# Patient Record
Sex: Female | Born: 1960 | Race: White | Hispanic: No | Marital: Married | Smoking: Never smoker
Health system: Southern US, Community
[De-identification: ages and names within clinical notes are randomized; demographics above are authoritative.]

## PROBLEM LIST (undated history)

## (undated) DIAGNOSIS — F32A Depression, unspecified: Secondary | ICD-10-CM

## (undated) DIAGNOSIS — F419 Anxiety disorder, unspecified: Secondary | ICD-10-CM

## (undated) DIAGNOSIS — R011 Cardiac murmur, unspecified: Secondary | ICD-10-CM

## (undated) DIAGNOSIS — C801 Malignant (primary) neoplasm, unspecified: Secondary | ICD-10-CM

## (undated) DIAGNOSIS — J189 Pneumonia, unspecified organism: Secondary | ICD-10-CM

## (undated) DIAGNOSIS — I10 Essential (primary) hypertension: Secondary | ICD-10-CM

## (undated) DIAGNOSIS — Z8719 Personal history of other diseases of the digestive system: Secondary | ICD-10-CM

## (undated) DIAGNOSIS — M1990 Unspecified osteoarthritis, unspecified site: Secondary | ICD-10-CM

## (undated) DIAGNOSIS — E785 Hyperlipidemia, unspecified: Secondary | ICD-10-CM

## (undated) DIAGNOSIS — E119 Type 2 diabetes mellitus without complications: Secondary | ICD-10-CM

## (undated) HISTORY — DX: Type 2 diabetes mellitus without complications: E11.9

## (undated) HISTORY — DX: Morbid (severe) obesity due to excess calories: E66.01

## (undated) HISTORY — DX: Anxiety disorder, unspecified: F41.9

## (undated) HISTORY — DX: Hyperlipidemia, unspecified: E78.5

## (undated) HISTORY — DX: Essential (primary) hypertension: I10

## (undated) HISTORY — PX: LIGAMENT REPAIR: SHX5444

## (undated) HISTORY — PX: LAPAROSCOPIC GASTRIC BANDING: SHX1100

## (undated) HISTORY — DX: Depression, unspecified: F32.A

---

## 1998-04-22 HISTORY — PX: REPLACEMENT TOTAL KNEE: SUR1224

## 2016-11-08 ENCOUNTER — Other Ambulatory Visit: Payer: Self-pay | Admitting: Family Medicine

## 2016-11-08 DIAGNOSIS — Z1231 Encounter for screening mammogram for malignant neoplasm of breast: Secondary | ICD-10-CM

## 2016-11-20 ENCOUNTER — Ambulatory Visit
Admission: RE | Admit: 2016-11-20 | Discharge: 2016-11-20 | Disposition: A | Discharge status: Home / Self Care | Payer: 59 | Source: Ambulatory Visit | Attending: Family Medicine | Admitting: Family Medicine

## 2016-11-20 ENCOUNTER — Encounter: Payer: Self-pay | Admitting: Radiology

## 2016-11-20 DIAGNOSIS — Z1231 Encounter for screening mammogram for malignant neoplasm of breast: Secondary | ICD-10-CM

## 2016-11-26 ENCOUNTER — Other Ambulatory Visit: Payer: Self-pay | Admitting: Family Medicine

## 2016-11-26 DIAGNOSIS — R928 Other abnormal and inconclusive findings on diagnostic imaging of breast: Secondary | ICD-10-CM

## 2016-11-28 ENCOUNTER — Ambulatory Visit
Admission: RE | Admit: 2016-11-28 | Discharge: 2016-11-28 | Disposition: A | Discharge status: Home / Self Care | Payer: 59 | Source: Ambulatory Visit | Attending: Family Medicine | Admitting: Family Medicine

## 2016-11-28 DIAGNOSIS — R928 Other abnormal and inconclusive findings on diagnostic imaging of breast: Secondary | ICD-10-CM

## 2017-05-13 ENCOUNTER — Other Ambulatory Visit: Payer: Self-pay | Admitting: Family Medicine

## 2017-05-13 ENCOUNTER — Ambulatory Visit
Admission: RE | Admit: 2017-05-13 | Discharge: 2017-05-13 | Disposition: A | Discharge status: Home / Self Care | Payer: 59 | Source: Ambulatory Visit | Attending: Family Medicine | Admitting: Family Medicine

## 2017-05-13 DIAGNOSIS — M170 Bilateral primary osteoarthritis of knee: Secondary | ICD-10-CM

## 2017-11-12 ENCOUNTER — Other Ambulatory Visit: Payer: Self-pay | Admitting: Family Medicine

## 2017-11-12 DIAGNOSIS — Z1231 Encounter for screening mammogram for malignant neoplasm of breast: Secondary | ICD-10-CM

## 2017-12-03 ENCOUNTER — Ambulatory Visit
Admission: RE | Admit: 2017-12-03 | Discharge: 2017-12-03 | Disposition: A | Discharge status: Home / Self Care | Payer: No Typology Code available for payment source | Source: Ambulatory Visit | Attending: Family Medicine | Admitting: Family Medicine

## 2017-12-03 DIAGNOSIS — Z1231 Encounter for screening mammogram for malignant neoplasm of breast: Secondary | ICD-10-CM

## 2018-05-25 DIAGNOSIS — R7303 Prediabetes: Secondary | ICD-10-CM | POA: Diagnosis not present

## 2018-05-25 DIAGNOSIS — Z6841 Body Mass Index (BMI) 40.0 and over, adult: Secondary | ICD-10-CM | POA: Diagnosis not present

## 2018-05-25 DIAGNOSIS — E785 Hyperlipidemia, unspecified: Secondary | ICD-10-CM | POA: Diagnosis not present

## 2018-05-25 DIAGNOSIS — I10 Essential (primary) hypertension: Secondary | ICD-10-CM | POA: Diagnosis not present

## 2018-06-05 DIAGNOSIS — M542 Cervicalgia: Secondary | ICD-10-CM | POA: Diagnosis not present

## 2018-06-05 DIAGNOSIS — M545 Low back pain: Secondary | ICD-10-CM | POA: Diagnosis not present

## 2018-06-05 DIAGNOSIS — Z79891 Long term (current) use of opiate analgesic: Secondary | ICD-10-CM | POA: Diagnosis not present

## 2018-06-05 DIAGNOSIS — G894 Chronic pain syndrome: Secondary | ICD-10-CM | POA: Diagnosis not present

## 2018-06-05 DIAGNOSIS — M25569 Pain in unspecified knee: Secondary | ICD-10-CM | POA: Diagnosis not present

## 2018-06-24 DIAGNOSIS — R7303 Prediabetes: Secondary | ICD-10-CM | POA: Diagnosis not present

## 2018-06-24 DIAGNOSIS — Z6841 Body Mass Index (BMI) 40.0 and over, adult: Secondary | ICD-10-CM | POA: Diagnosis not present

## 2018-06-24 DIAGNOSIS — E782 Mixed hyperlipidemia: Secondary | ICD-10-CM | POA: Diagnosis not present

## 2018-06-24 DIAGNOSIS — I10 Essential (primary) hypertension: Secondary | ICD-10-CM | POA: Diagnosis not present

## 2018-06-24 DIAGNOSIS — J309 Allergic rhinitis, unspecified: Secondary | ICD-10-CM | POA: Diagnosis not present

## 2018-08-28 DIAGNOSIS — Z79891 Long term (current) use of opiate analgesic: Secondary | ICD-10-CM | POA: Diagnosis not present

## 2018-08-28 DIAGNOSIS — G894 Chronic pain syndrome: Secondary | ICD-10-CM | POA: Diagnosis not present

## 2018-08-28 DIAGNOSIS — M542 Cervicalgia: Secondary | ICD-10-CM | POA: Diagnosis not present

## 2018-08-28 DIAGNOSIS — M25569 Pain in unspecified knee: Secondary | ICD-10-CM | POA: Diagnosis not present

## 2018-08-28 DIAGNOSIS — M545 Low back pain: Secondary | ICD-10-CM | POA: Diagnosis not present

## 2018-10-29 DIAGNOSIS — F411 Generalized anxiety disorder: Secondary | ICD-10-CM | POA: Diagnosis not present

## 2018-10-29 DIAGNOSIS — E782 Mixed hyperlipidemia: Secondary | ICD-10-CM | POA: Diagnosis not present

## 2018-10-29 DIAGNOSIS — I10 Essential (primary) hypertension: Secondary | ICD-10-CM | POA: Diagnosis not present

## 2018-10-29 DIAGNOSIS — F331 Major depressive disorder, recurrent, moderate: Secondary | ICD-10-CM | POA: Diagnosis not present

## 2018-10-29 DIAGNOSIS — E1165 Type 2 diabetes mellitus with hyperglycemia: Secondary | ICD-10-CM | POA: Diagnosis not present

## 2018-11-16 DIAGNOSIS — E782 Mixed hyperlipidemia: Secondary | ICD-10-CM | POA: Diagnosis not present

## 2018-11-16 DIAGNOSIS — I10 Essential (primary) hypertension: Secondary | ICD-10-CM | POA: Diagnosis not present

## 2018-11-16 DIAGNOSIS — E1165 Type 2 diabetes mellitus with hyperglycemia: Secondary | ICD-10-CM | POA: Diagnosis not present

## 2018-11-20 DIAGNOSIS — F331 Major depressive disorder, recurrent, moderate: Secondary | ICD-10-CM | POA: Diagnosis not present

## 2018-11-20 DIAGNOSIS — F411 Generalized anxiety disorder: Secondary | ICD-10-CM | POA: Diagnosis not present

## 2018-11-20 DIAGNOSIS — E1165 Type 2 diabetes mellitus with hyperglycemia: Secondary | ICD-10-CM | POA: Diagnosis not present

## 2018-11-20 DIAGNOSIS — I10 Essential (primary) hypertension: Secondary | ICD-10-CM | POA: Diagnosis not present

## 2018-11-20 DIAGNOSIS — E782 Mixed hyperlipidemia: Secondary | ICD-10-CM | POA: Diagnosis not present

## 2018-11-30 DIAGNOSIS — M545 Low back pain: Secondary | ICD-10-CM | POA: Diagnosis not present

## 2018-11-30 DIAGNOSIS — M25569 Pain in unspecified knee: Secondary | ICD-10-CM | POA: Diagnosis not present

## 2018-11-30 DIAGNOSIS — M542 Cervicalgia: Secondary | ICD-10-CM | POA: Diagnosis not present

## 2018-11-30 DIAGNOSIS — G894 Chronic pain syndrome: Secondary | ICD-10-CM | POA: Diagnosis not present

## 2018-11-30 DIAGNOSIS — Z79891 Long term (current) use of opiate analgesic: Secondary | ICD-10-CM | POA: Diagnosis not present

## 2019-01-14 ENCOUNTER — Other Ambulatory Visit: Payer: Self-pay | Admitting: Family Medicine

## 2019-01-14 ENCOUNTER — Ambulatory Visit
Admission: RE | Admit: 2019-01-14 | Discharge: 2019-01-14 | Disposition: A | Discharge status: Home / Self Care | Payer: No Typology Code available for payment source | Source: Ambulatory Visit | Attending: Family Medicine | Admitting: Family Medicine

## 2019-01-14 ENCOUNTER — Other Ambulatory Visit: Payer: Self-pay

## 2019-01-14 DIAGNOSIS — Z1231 Encounter for screening mammogram for malignant neoplasm of breast: Secondary | ICD-10-CM

## 2019-02-08 DIAGNOSIS — E1165 Type 2 diabetes mellitus with hyperglycemia: Secondary | ICD-10-CM | POA: Diagnosis not present

## 2019-02-15 DIAGNOSIS — F411 Generalized anxiety disorder: Secondary | ICD-10-CM | POA: Diagnosis not present

## 2019-02-15 DIAGNOSIS — E1165 Type 2 diabetes mellitus with hyperglycemia: Secondary | ICD-10-CM | POA: Diagnosis not present

## 2019-02-15 DIAGNOSIS — I10 Essential (primary) hypertension: Secondary | ICD-10-CM | POA: Diagnosis not present

## 2019-03-01 DIAGNOSIS — Z79891 Long term (current) use of opiate analgesic: Secondary | ICD-10-CM | POA: Diagnosis not present

## 2019-03-01 DIAGNOSIS — M25569 Pain in unspecified knee: Secondary | ICD-10-CM | POA: Diagnosis not present

## 2019-03-01 DIAGNOSIS — E785 Hyperlipidemia, unspecified: Secondary | ICD-10-CM | POA: Diagnosis not present

## 2019-03-01 DIAGNOSIS — E782 Mixed hyperlipidemia: Secondary | ICD-10-CM | POA: Diagnosis not present

## 2019-03-01 DIAGNOSIS — M545 Low back pain: Secondary | ICD-10-CM | POA: Diagnosis not present

## 2019-03-01 DIAGNOSIS — E1165 Type 2 diabetes mellitus with hyperglycemia: Secondary | ICD-10-CM | POA: Diagnosis not present

## 2019-03-01 DIAGNOSIS — M542 Cervicalgia: Secondary | ICD-10-CM | POA: Diagnosis not present

## 2019-03-01 DIAGNOSIS — G894 Chronic pain syndrome: Secondary | ICD-10-CM | POA: Diagnosis not present

## 2019-03-01 DIAGNOSIS — I10 Essential (primary) hypertension: Secondary | ICD-10-CM | POA: Diagnosis not present

## 2019-03-03 DIAGNOSIS — Z0000 Encounter for general adult medical examination without abnormal findings: Secondary | ICD-10-CM | POA: Diagnosis not present

## 2019-03-03 DIAGNOSIS — Z23 Encounter for immunization: Secondary | ICD-10-CM | POA: Diagnosis not present

## 2019-03-03 DIAGNOSIS — Z6841 Body Mass Index (BMI) 40.0 and over, adult: Secondary | ICD-10-CM | POA: Diagnosis not present

## 2019-03-03 DIAGNOSIS — Z1211 Encounter for screening for malignant neoplasm of colon: Secondary | ICD-10-CM | POA: Diagnosis not present

## 2019-05-11 DIAGNOSIS — M4722 Other spondylosis with radiculopathy, cervical region: Secondary | ICD-10-CM | POA: Diagnosis not present

## 2019-05-11 DIAGNOSIS — M4726 Other spondylosis with radiculopathy, lumbar region: Secondary | ICD-10-CM | POA: Diagnosis not present

## 2019-05-11 DIAGNOSIS — M9902 Segmental and somatic dysfunction of thoracic region: Secondary | ICD-10-CM | POA: Diagnosis not present

## 2019-05-11 DIAGNOSIS — M9901 Segmental and somatic dysfunction of cervical region: Secondary | ICD-10-CM | POA: Diagnosis not present

## 2019-05-11 DIAGNOSIS — M9903 Segmental and somatic dysfunction of lumbar region: Secondary | ICD-10-CM | POA: Diagnosis not present

## 2019-05-11 DIAGNOSIS — M62830 Muscle spasm of back: Secondary | ICD-10-CM | POA: Diagnosis not present

## 2019-05-17 DIAGNOSIS — M62830 Muscle spasm of back: Secondary | ICD-10-CM | POA: Diagnosis not present

## 2019-05-17 DIAGNOSIS — M9902 Segmental and somatic dysfunction of thoracic region: Secondary | ICD-10-CM | POA: Diagnosis not present

## 2019-05-17 DIAGNOSIS — M9901 Segmental and somatic dysfunction of cervical region: Secondary | ICD-10-CM | POA: Diagnosis not present

## 2019-05-17 DIAGNOSIS — M4722 Other spondylosis with radiculopathy, cervical region: Secondary | ICD-10-CM | POA: Diagnosis not present

## 2019-05-17 DIAGNOSIS — M4726 Other spondylosis with radiculopathy, lumbar region: Secondary | ICD-10-CM | POA: Diagnosis not present

## 2019-05-17 DIAGNOSIS — M9903 Segmental and somatic dysfunction of lumbar region: Secondary | ICD-10-CM | POA: Diagnosis not present

## 2019-05-19 DIAGNOSIS — M9903 Segmental and somatic dysfunction of lumbar region: Secondary | ICD-10-CM | POA: Diagnosis not present

## 2019-05-19 DIAGNOSIS — M9901 Segmental and somatic dysfunction of cervical region: Secondary | ICD-10-CM | POA: Diagnosis not present

## 2019-05-19 DIAGNOSIS — M4722 Other spondylosis with radiculopathy, cervical region: Secondary | ICD-10-CM | POA: Diagnosis not present

## 2019-05-19 DIAGNOSIS — M4726 Other spondylosis with radiculopathy, lumbar region: Secondary | ICD-10-CM | POA: Diagnosis not present

## 2019-05-19 DIAGNOSIS — M62830 Muscle spasm of back: Secondary | ICD-10-CM | POA: Diagnosis not present

## 2019-05-19 DIAGNOSIS — M9902 Segmental and somatic dysfunction of thoracic region: Secondary | ICD-10-CM | POA: Diagnosis not present

## 2019-05-24 DIAGNOSIS — M4722 Other spondylosis with radiculopathy, cervical region: Secondary | ICD-10-CM | POA: Diagnosis not present

## 2019-05-24 DIAGNOSIS — M4726 Other spondylosis with radiculopathy, lumbar region: Secondary | ICD-10-CM | POA: Diagnosis not present

## 2019-05-24 DIAGNOSIS — M62830 Muscle spasm of back: Secondary | ICD-10-CM | POA: Diagnosis not present

## 2019-05-24 DIAGNOSIS — M9902 Segmental and somatic dysfunction of thoracic region: Secondary | ICD-10-CM | POA: Diagnosis not present

## 2019-05-24 DIAGNOSIS — M9903 Segmental and somatic dysfunction of lumbar region: Secondary | ICD-10-CM | POA: Diagnosis not present

## 2019-05-24 DIAGNOSIS — M9901 Segmental and somatic dysfunction of cervical region: Secondary | ICD-10-CM | POA: Diagnosis not present

## 2019-05-26 DIAGNOSIS — M9902 Segmental and somatic dysfunction of thoracic region: Secondary | ICD-10-CM | POA: Diagnosis not present

## 2019-05-26 DIAGNOSIS — M62830 Muscle spasm of back: Secondary | ICD-10-CM | POA: Diagnosis not present

## 2019-05-26 DIAGNOSIS — M9903 Segmental and somatic dysfunction of lumbar region: Secondary | ICD-10-CM | POA: Diagnosis not present

## 2019-05-26 DIAGNOSIS — M9901 Segmental and somatic dysfunction of cervical region: Secondary | ICD-10-CM | POA: Diagnosis not present

## 2019-05-26 DIAGNOSIS — M4722 Other spondylosis with radiculopathy, cervical region: Secondary | ICD-10-CM | POA: Diagnosis not present

## 2019-05-26 DIAGNOSIS — M4726 Other spondylosis with radiculopathy, lumbar region: Secondary | ICD-10-CM | POA: Diagnosis not present

## 2019-05-27 DIAGNOSIS — G894 Chronic pain syndrome: Secondary | ICD-10-CM | POA: Diagnosis not present

## 2019-05-27 DIAGNOSIS — Z79891 Long term (current) use of opiate analgesic: Secondary | ICD-10-CM | POA: Diagnosis not present

## 2019-05-27 DIAGNOSIS — M545 Low back pain: Secondary | ICD-10-CM | POA: Diagnosis not present

## 2019-05-27 DIAGNOSIS — M542 Cervicalgia: Secondary | ICD-10-CM | POA: Diagnosis not present

## 2019-05-27 DIAGNOSIS — M25569 Pain in unspecified knee: Secondary | ICD-10-CM | POA: Diagnosis not present

## 2019-05-31 DIAGNOSIS — M9902 Segmental and somatic dysfunction of thoracic region: Secondary | ICD-10-CM | POA: Diagnosis not present

## 2019-05-31 DIAGNOSIS — M62830 Muscle spasm of back: Secondary | ICD-10-CM | POA: Diagnosis not present

## 2019-05-31 DIAGNOSIS — M9901 Segmental and somatic dysfunction of cervical region: Secondary | ICD-10-CM | POA: Diagnosis not present

## 2019-05-31 DIAGNOSIS — M4722 Other spondylosis with radiculopathy, cervical region: Secondary | ICD-10-CM | POA: Diagnosis not present

## 2019-05-31 DIAGNOSIS — M9903 Segmental and somatic dysfunction of lumbar region: Secondary | ICD-10-CM | POA: Diagnosis not present

## 2019-05-31 DIAGNOSIS — E785 Hyperlipidemia, unspecified: Secondary | ICD-10-CM | POA: Diagnosis not present

## 2019-05-31 DIAGNOSIS — M4726 Other spondylosis with radiculopathy, lumbar region: Secondary | ICD-10-CM | POA: Diagnosis not present

## 2019-05-31 DIAGNOSIS — E1165 Type 2 diabetes mellitus with hyperglycemia: Secondary | ICD-10-CM | POA: Diagnosis not present

## 2019-06-02 DIAGNOSIS — E1165 Type 2 diabetes mellitus with hyperglycemia: Secondary | ICD-10-CM | POA: Diagnosis not present

## 2019-06-02 DIAGNOSIS — Z6841 Body Mass Index (BMI) 40.0 and over, adult: Secondary | ICD-10-CM | POA: Diagnosis not present

## 2019-06-02 DIAGNOSIS — M62830 Muscle spasm of back: Secondary | ICD-10-CM | POA: Diagnosis not present

## 2019-06-02 DIAGNOSIS — M9901 Segmental and somatic dysfunction of cervical region: Secondary | ICD-10-CM | POA: Diagnosis not present

## 2019-06-02 DIAGNOSIS — I10 Essential (primary) hypertension: Secondary | ICD-10-CM | POA: Diagnosis not present

## 2019-06-02 DIAGNOSIS — M9902 Segmental and somatic dysfunction of thoracic region: Secondary | ICD-10-CM | POA: Diagnosis not present

## 2019-06-02 DIAGNOSIS — M4726 Other spondylosis with radiculopathy, lumbar region: Secondary | ICD-10-CM | POA: Diagnosis not present

## 2019-06-02 DIAGNOSIS — M9903 Segmental and somatic dysfunction of lumbar region: Secondary | ICD-10-CM | POA: Diagnosis not present

## 2019-06-02 DIAGNOSIS — M4722 Other spondylosis with radiculopathy, cervical region: Secondary | ICD-10-CM | POA: Diagnosis not present

## 2019-06-14 DIAGNOSIS — M4722 Other spondylosis with radiculopathy, cervical region: Secondary | ICD-10-CM | POA: Diagnosis not present

## 2019-06-14 DIAGNOSIS — M9901 Segmental and somatic dysfunction of cervical region: Secondary | ICD-10-CM | POA: Diagnosis not present

## 2019-06-14 DIAGNOSIS — M4726 Other spondylosis with radiculopathy, lumbar region: Secondary | ICD-10-CM | POA: Diagnosis not present

## 2019-06-14 DIAGNOSIS — M9902 Segmental and somatic dysfunction of thoracic region: Secondary | ICD-10-CM | POA: Diagnosis not present

## 2019-06-14 DIAGNOSIS — M62830 Muscle spasm of back: Secondary | ICD-10-CM | POA: Diagnosis not present

## 2019-06-14 DIAGNOSIS — M9903 Segmental and somatic dysfunction of lumbar region: Secondary | ICD-10-CM | POA: Diagnosis not present

## 2019-06-16 DIAGNOSIS — M4726 Other spondylosis with radiculopathy, lumbar region: Secondary | ICD-10-CM | POA: Diagnosis not present

## 2019-06-16 DIAGNOSIS — M9903 Segmental and somatic dysfunction of lumbar region: Secondary | ICD-10-CM | POA: Diagnosis not present

## 2019-06-16 DIAGNOSIS — M4722 Other spondylosis with radiculopathy, cervical region: Secondary | ICD-10-CM | POA: Diagnosis not present

## 2019-06-16 DIAGNOSIS — M9901 Segmental and somatic dysfunction of cervical region: Secondary | ICD-10-CM | POA: Diagnosis not present

## 2019-06-16 DIAGNOSIS — M9902 Segmental and somatic dysfunction of thoracic region: Secondary | ICD-10-CM | POA: Diagnosis not present

## 2019-06-16 DIAGNOSIS — M62830 Muscle spasm of back: Secondary | ICD-10-CM | POA: Diagnosis not present

## 2019-06-21 DIAGNOSIS — M62830 Muscle spasm of back: Secondary | ICD-10-CM | POA: Diagnosis not present

## 2019-06-21 DIAGNOSIS — M4726 Other spondylosis with radiculopathy, lumbar region: Secondary | ICD-10-CM | POA: Diagnosis not present

## 2019-06-21 DIAGNOSIS — M9901 Segmental and somatic dysfunction of cervical region: Secondary | ICD-10-CM | POA: Diagnosis not present

## 2019-06-21 DIAGNOSIS — M9902 Segmental and somatic dysfunction of thoracic region: Secondary | ICD-10-CM | POA: Diagnosis not present

## 2019-06-21 DIAGNOSIS — M9903 Segmental and somatic dysfunction of lumbar region: Secondary | ICD-10-CM | POA: Diagnosis not present

## 2019-06-21 DIAGNOSIS — M4722 Other spondylosis with radiculopathy, cervical region: Secondary | ICD-10-CM | POA: Diagnosis not present

## 2019-06-23 DIAGNOSIS — M9903 Segmental and somatic dysfunction of lumbar region: Secondary | ICD-10-CM | POA: Diagnosis not present

## 2019-06-23 DIAGNOSIS — M9901 Segmental and somatic dysfunction of cervical region: Secondary | ICD-10-CM | POA: Diagnosis not present

## 2019-06-23 DIAGNOSIS — M4726 Other spondylosis with radiculopathy, lumbar region: Secondary | ICD-10-CM | POA: Diagnosis not present

## 2019-06-23 DIAGNOSIS — M9902 Segmental and somatic dysfunction of thoracic region: Secondary | ICD-10-CM | POA: Diagnosis not present

## 2019-06-23 DIAGNOSIS — M62830 Muscle spasm of back: Secondary | ICD-10-CM | POA: Diagnosis not present

## 2019-06-23 DIAGNOSIS — M4722 Other spondylosis with radiculopathy, cervical region: Secondary | ICD-10-CM | POA: Diagnosis not present

## 2019-06-30 DIAGNOSIS — E559 Vitamin D deficiency, unspecified: Secondary | ICD-10-CM | POA: Diagnosis not present

## 2019-06-30 DIAGNOSIS — I10 Essential (primary) hypertension: Secondary | ICD-10-CM | POA: Diagnosis not present

## 2019-06-30 DIAGNOSIS — E1165 Type 2 diabetes mellitus with hyperglycemia: Secondary | ICD-10-CM | POA: Diagnosis not present

## 2019-06-30 DIAGNOSIS — Z0000 Encounter for general adult medical examination without abnormal findings: Secondary | ICD-10-CM | POA: Diagnosis not present

## 2019-07-05 DIAGNOSIS — I10 Essential (primary) hypertension: Secondary | ICD-10-CM | POA: Diagnosis not present

## 2019-07-05 DIAGNOSIS — E1165 Type 2 diabetes mellitus with hyperglycemia: Secondary | ICD-10-CM | POA: Diagnosis not present

## 2019-07-07 DIAGNOSIS — M9903 Segmental and somatic dysfunction of lumbar region: Secondary | ICD-10-CM | POA: Diagnosis not present

## 2019-07-07 DIAGNOSIS — M9902 Segmental and somatic dysfunction of thoracic region: Secondary | ICD-10-CM | POA: Diagnosis not present

## 2019-07-07 DIAGNOSIS — M62830 Muscle spasm of back: Secondary | ICD-10-CM | POA: Diagnosis not present

## 2019-07-07 DIAGNOSIS — M4726 Other spondylosis with radiculopathy, lumbar region: Secondary | ICD-10-CM | POA: Diagnosis not present

## 2019-07-07 DIAGNOSIS — M4722 Other spondylosis with radiculopathy, cervical region: Secondary | ICD-10-CM | POA: Diagnosis not present

## 2019-07-07 DIAGNOSIS — M9901 Segmental and somatic dysfunction of cervical region: Secondary | ICD-10-CM | POA: Diagnosis not present

## 2019-07-19 DIAGNOSIS — J309 Allergic rhinitis, unspecified: Secondary | ICD-10-CM | POA: Diagnosis not present

## 2019-07-19 DIAGNOSIS — J329 Chronic sinusitis, unspecified: Secondary | ICD-10-CM | POA: Diagnosis not present

## 2019-08-25 DIAGNOSIS — M25569 Pain in unspecified knee: Secondary | ICD-10-CM | POA: Diagnosis not present

## 2019-08-25 DIAGNOSIS — M545 Low back pain: Secondary | ICD-10-CM | POA: Diagnosis not present

## 2019-08-25 DIAGNOSIS — G894 Chronic pain syndrome: Secondary | ICD-10-CM | POA: Diagnosis not present

## 2019-08-25 DIAGNOSIS — M542 Cervicalgia: Secondary | ICD-10-CM | POA: Diagnosis not present

## 2019-08-25 DIAGNOSIS — Z79891 Long term (current) use of opiate analgesic: Secondary | ICD-10-CM | POA: Diagnosis not present

## 2019-08-31 DIAGNOSIS — Z23 Encounter for immunization: Secondary | ICD-10-CM | POA: Diagnosis not present

## 2019-08-31 DIAGNOSIS — M25512 Pain in left shoulder: Secondary | ICD-10-CM | POA: Diagnosis not present

## 2019-09-30 DIAGNOSIS — E1165 Type 2 diabetes mellitus with hyperglycemia: Secondary | ICD-10-CM | POA: Diagnosis not present

## 2019-10-04 DIAGNOSIS — I152 Hypertension secondary to endocrine disorders: Secondary | ICD-10-CM | POA: Diagnosis not present

## 2019-10-04 DIAGNOSIS — E1165 Type 2 diabetes mellitus with hyperglycemia: Secondary | ICD-10-CM | POA: Diagnosis not present

## 2019-11-05 DIAGNOSIS — E785 Hyperlipidemia, unspecified: Secondary | ICD-10-CM | POA: Diagnosis not present

## 2019-11-05 DIAGNOSIS — E1165 Type 2 diabetes mellitus with hyperglycemia: Secondary | ICD-10-CM | POA: Diagnosis not present

## 2019-11-05 DIAGNOSIS — I10 Essential (primary) hypertension: Secondary | ICD-10-CM | POA: Diagnosis not present

## 2019-11-12 DIAGNOSIS — M545 Low back pain: Secondary | ICD-10-CM | POA: Diagnosis not present

## 2019-11-12 DIAGNOSIS — M542 Cervicalgia: Secondary | ICD-10-CM | POA: Diagnosis not present

## 2019-12-15 ENCOUNTER — Other Ambulatory Visit: Payer: Self-pay | Admitting: Family Medicine

## 2019-12-15 DIAGNOSIS — Z1231 Encounter for screening mammogram for malignant neoplasm of breast: Secondary | ICD-10-CM

## 2019-12-20 DIAGNOSIS — Z79891 Long term (current) use of opiate analgesic: Secondary | ICD-10-CM | POA: Diagnosis not present

## 2020-01-18 ENCOUNTER — Ambulatory Visit: Payer: No Typology Code available for payment source

## 2020-01-21 DIAGNOSIS — J329 Chronic sinusitis, unspecified: Secondary | ICD-10-CM | POA: Diagnosis not present

## 2020-02-02 ENCOUNTER — Other Ambulatory Visit: Payer: Self-pay

## 2020-02-02 ENCOUNTER — Ambulatory Visit
Admission: RE | Admit: 2020-02-02 | Discharge: 2020-02-02 | Disposition: A | Discharge status: Home / Self Care | Payer: Medicare Other | Source: Ambulatory Visit | Attending: Family Medicine | Admitting: Family Medicine

## 2020-02-02 DIAGNOSIS — Z1231 Encounter for screening mammogram for malignant neoplasm of breast: Secondary | ICD-10-CM | POA: Diagnosis not present

## 2020-03-11 DIAGNOSIS — Z79899 Other long term (current) drug therapy: Secondary | ICD-10-CM | POA: Diagnosis not present

## 2020-03-11 DIAGNOSIS — G894 Chronic pain syndrome: Secondary | ICD-10-CM | POA: Insufficient documentation

## 2020-03-11 DIAGNOSIS — Z79891 Long term (current) use of opiate analgesic: Secondary | ICD-10-CM | POA: Diagnosis not present

## 2020-03-11 DIAGNOSIS — M545 Low back pain, unspecified: Secondary | ICD-10-CM | POA: Diagnosis not present

## 2020-03-13 DIAGNOSIS — Z5181 Encounter for therapeutic drug level monitoring: Secondary | ICD-10-CM | POA: Diagnosis not present

## 2020-03-13 DIAGNOSIS — Z79899 Other long term (current) drug therapy: Secondary | ICD-10-CM | POA: Diagnosis not present

## 2020-03-14 DIAGNOSIS — Z1331 Encounter for screening for depression: Secondary | ICD-10-CM | POA: Diagnosis not present

## 2020-03-14 DIAGNOSIS — Z0000 Encounter for general adult medical examination without abnormal findings: Secondary | ICD-10-CM | POA: Diagnosis not present

## 2020-03-14 DIAGNOSIS — Z1339 Encounter for screening examination for other mental health and behavioral disorders: Secondary | ICD-10-CM | POA: Diagnosis not present

## 2020-03-14 DIAGNOSIS — Z23 Encounter for immunization: Secondary | ICD-10-CM | POA: Diagnosis not present

## 2020-03-23 DIAGNOSIS — E1165 Type 2 diabetes mellitus with hyperglycemia: Secondary | ICD-10-CM | POA: Diagnosis not present

## 2020-03-23 DIAGNOSIS — E785 Hyperlipidemia, unspecified: Secondary | ICD-10-CM | POA: Diagnosis not present

## 2020-03-23 DIAGNOSIS — E782 Mixed hyperlipidemia: Secondary | ICD-10-CM | POA: Diagnosis not present

## 2020-03-23 DIAGNOSIS — I10 Essential (primary) hypertension: Secondary | ICD-10-CM | POA: Diagnosis not present

## 2020-03-27 DIAGNOSIS — E782 Mixed hyperlipidemia: Secondary | ICD-10-CM | POA: Diagnosis not present

## 2020-03-27 DIAGNOSIS — G4700 Insomnia, unspecified: Secondary | ICD-10-CM | POA: Diagnosis not present

## 2020-03-27 DIAGNOSIS — E1159 Type 2 diabetes mellitus with other circulatory complications: Secondary | ICD-10-CM | POA: Diagnosis not present

## 2020-03-27 DIAGNOSIS — E1169 Type 2 diabetes mellitus with other specified complication: Secondary | ICD-10-CM | POA: Diagnosis not present

## 2020-03-27 DIAGNOSIS — I10 Essential (primary) hypertension: Secondary | ICD-10-CM | POA: Diagnosis not present

## 2020-03-27 DIAGNOSIS — E1165 Type 2 diabetes mellitus with hyperglycemia: Secondary | ICD-10-CM | POA: Diagnosis not present

## 2020-03-27 DIAGNOSIS — F331 Major depressive disorder, recurrent, moderate: Secondary | ICD-10-CM | POA: Diagnosis not present

## 2020-03-27 DIAGNOSIS — F411 Generalized anxiety disorder: Secondary | ICD-10-CM | POA: Diagnosis not present

## 2020-04-22 HISTORY — PX: ABDOMINAL HYSTERECTOMY: SHX81

## 2020-04-28 DIAGNOSIS — Z20822 Contact with and (suspected) exposure to covid-19: Secondary | ICD-10-CM | POA: Diagnosis not present

## 2020-06-05 DIAGNOSIS — M5459 Other low back pain: Secondary | ICD-10-CM | POA: Diagnosis not present

## 2020-06-28 DIAGNOSIS — E1165 Type 2 diabetes mellitus with hyperglycemia: Secondary | ICD-10-CM | POA: Diagnosis not present

## 2020-06-30 DIAGNOSIS — I152 Hypertension secondary to endocrine disorders: Secondary | ICD-10-CM | POA: Diagnosis not present

## 2020-06-30 DIAGNOSIS — I10 Essential (primary) hypertension: Secondary | ICD-10-CM | POA: Diagnosis not present

## 2020-06-30 DIAGNOSIS — E1169 Type 2 diabetes mellitus with other specified complication: Secondary | ICD-10-CM | POA: Diagnosis not present

## 2020-06-30 DIAGNOSIS — F331 Major depressive disorder, recurrent, moderate: Secondary | ICD-10-CM | POA: Diagnosis not present

## 2020-06-30 DIAGNOSIS — E1165 Type 2 diabetes mellitus with hyperglycemia: Secondary | ICD-10-CM | POA: Diagnosis not present

## 2020-06-30 DIAGNOSIS — G4700 Insomnia, unspecified: Secondary | ICD-10-CM | POA: Diagnosis not present

## 2020-06-30 DIAGNOSIS — F411 Generalized anxiety disorder: Secondary | ICD-10-CM | POA: Diagnosis not present

## 2020-09-04 DIAGNOSIS — G894 Chronic pain syndrome: Secondary | ICD-10-CM | POA: Diagnosis not present

## 2020-09-04 DIAGNOSIS — Z79891 Long term (current) use of opiate analgesic: Secondary | ICD-10-CM | POA: Diagnosis not present

## 2020-09-28 DIAGNOSIS — E1165 Type 2 diabetes mellitus with hyperglycemia: Secondary | ICD-10-CM | POA: Diagnosis not present

## 2020-10-02 DIAGNOSIS — F411 Generalized anxiety disorder: Secondary | ICD-10-CM | POA: Diagnosis not present

## 2020-10-02 DIAGNOSIS — G4700 Insomnia, unspecified: Secondary | ICD-10-CM | POA: Diagnosis not present

## 2020-10-02 DIAGNOSIS — R400 Somnolence: Secondary | ICD-10-CM | POA: Diagnosis not present

## 2020-10-02 DIAGNOSIS — F331 Major depressive disorder, recurrent, moderate: Secondary | ICD-10-CM | POA: Diagnosis not present

## 2020-10-02 DIAGNOSIS — E1165 Type 2 diabetes mellitus with hyperglycemia: Secondary | ICD-10-CM | POA: Diagnosis not present

## 2020-12-14 DIAGNOSIS — G894 Chronic pain syndrome: Secondary | ICD-10-CM | POA: Diagnosis not present

## 2021-01-03 DIAGNOSIS — E1165 Type 2 diabetes mellitus with hyperglycemia: Secondary | ICD-10-CM | POA: Diagnosis not present

## 2021-01-03 DIAGNOSIS — I10 Essential (primary) hypertension: Secondary | ICD-10-CM | POA: Diagnosis not present

## 2021-01-03 DIAGNOSIS — E1169 Type 2 diabetes mellitus with other specified complication: Secondary | ICD-10-CM | POA: Diagnosis not present

## 2021-01-03 DIAGNOSIS — E782 Mixed hyperlipidemia: Secondary | ICD-10-CM | POA: Diagnosis not present

## 2021-02-14 ENCOUNTER — Other Ambulatory Visit: Payer: Self-pay | Admitting: Family Medicine

## 2021-02-14 DIAGNOSIS — Z1231 Encounter for screening mammogram for malignant neoplasm of breast: Secondary | ICD-10-CM

## 2021-03-19 ENCOUNTER — Other Ambulatory Visit: Payer: Self-pay

## 2021-03-19 ENCOUNTER — Ambulatory Visit
Admission: RE | Admit: 2021-03-19 | Discharge: 2021-03-19 | Disposition: A | Discharge status: Home / Self Care | Payer: Medicare Other | Source: Ambulatory Visit | Attending: Family Medicine | Admitting: Family Medicine

## 2021-03-19 DIAGNOSIS — Z1231 Encounter for screening mammogram for malignant neoplasm of breast: Secondary | ICD-10-CM

## 2021-03-19 DIAGNOSIS — Z79891 Long term (current) use of opiate analgesic: Secondary | ICD-10-CM | POA: Diagnosis not present

## 2021-03-19 DIAGNOSIS — G894 Chronic pain syndrome: Secondary | ICD-10-CM | POA: Diagnosis not present

## 2021-03-19 DIAGNOSIS — Z5181 Encounter for therapeutic drug level monitoring: Secondary | ICD-10-CM | POA: Diagnosis not present

## 2021-03-19 DIAGNOSIS — M545 Low back pain, unspecified: Secondary | ICD-10-CM | POA: Diagnosis not present

## 2021-04-09 DIAGNOSIS — E1165 Type 2 diabetes mellitus with hyperglycemia: Secondary | ICD-10-CM | POA: Diagnosis not present

## 2021-04-09 DIAGNOSIS — I10 Essential (primary) hypertension: Secondary | ICD-10-CM | POA: Diagnosis not present

## 2021-04-09 DIAGNOSIS — N950 Postmenopausal bleeding: Secondary | ICD-10-CM | POA: Diagnosis not present

## 2021-04-10 ENCOUNTER — Other Ambulatory Visit: Payer: Self-pay | Admitting: Nurse Practitioner

## 2021-04-10 DIAGNOSIS — N950 Postmenopausal bleeding: Secondary | ICD-10-CM

## 2021-04-17 ENCOUNTER — Ambulatory Visit
Admission: RE | Admit: 2021-04-17 | Discharge: 2021-04-17 | Disposition: A | Discharge status: Home / Self Care | Payer: Medicare Other | Source: Ambulatory Visit | Attending: Nurse Practitioner | Admitting: Nurse Practitioner

## 2021-04-17 DIAGNOSIS — R9389 Abnormal findings on diagnostic imaging of other specified body structures: Secondary | ICD-10-CM | POA: Diagnosis not present

## 2021-04-17 DIAGNOSIS — N950 Postmenopausal bleeding: Secondary | ICD-10-CM | POA: Diagnosis not present

## 2021-04-17 DIAGNOSIS — N8500 Endometrial hyperplasia, unspecified: Secondary | ICD-10-CM | POA: Diagnosis not present

## 2021-04-17 DIAGNOSIS — N858 Other specified noninflammatory disorders of uterus: Secondary | ICD-10-CM | POA: Diagnosis not present

## 2021-04-23 DIAGNOSIS — C541 Malignant neoplasm of endometrium: Secondary | ICD-10-CM | POA: Diagnosis not present

## 2021-04-23 DIAGNOSIS — N950 Postmenopausal bleeding: Secondary | ICD-10-CM | POA: Diagnosis not present

## 2021-04-30 ENCOUNTER — Telehealth: Payer: Self-pay | Admitting: *Deleted

## 2021-04-30 NOTE — Telephone Encounter (Signed)
Spoke with the patient and scheduled a new patient appt with Dr Berline Lopes on 1/16 at 9:45 am. Patient given arrival time of 9:15 am. Patient given the address and phone number for the clinic; along with the policy for masks and visitors

## 2021-05-01 ENCOUNTER — Encounter: Payer: Self-pay | Admitting: Gynecologic Oncology

## 2021-05-06 NOTE — Progress Notes (Signed)
GYNECOLOGIC ONCOLOGY NEW PATIENT CONSULTATION   Patient Name: Claire Arnold  Patient Age: 61 y.o. Date of Service: 05/07/21 Referring Provider: Dr. Cheri Fowler  Primary Care Provider: Fanny Bien, MD Consulting Provider: Jeral Pinch, MD   Assessment/Plan:  Postmenopausal patient with clinical stage I endometrial cancer.   We reviewed the nature of endometrial cancer and its recommended surgical staging, including total hysterectomy, bilateral salpingo-oophorectomy, and lymph node assessment. The patient is a suitable candidate for attempt at staging via a minimally invasive approach to surgery.  We reviewed that robotic assistance would be used to complete the surgery.   We discussed that most endometrial cancer is detected early and that decisions regarding adjuvant therapy will be made based on her final pathology.   We reviewed the sentinel lymph node technique. Risks and benefits of sentinel lymph node biopsy was reviewed. We reviewed the technique and ICG dye. The patient DOES NOT have an iodine allergy or known liver dysfunction. We reviewed the false negative rate (0.4%), and that 3% of patients with metastatic disease will not have it detected by SLN biopsy in endometrial cancer. A low risk of allergic reaction to the dye, <0.2% for ICG, has been reported. We also discussed that in the case of failed mapping, which occurs 40% of the time, a bilateral or unilateral lymphadenectomy will be performed at the surgeons discretion.   Potential benefits of sentinel nodes including a higher detection rate for metastasis due to ultrastaging and potential reduction in operative morbidity. However, there remains uncertainty as to the role for treatment of micrometastatic disease. Further, the benefit of operative morbidity associated with the SLN technique in endometrial cancer is not yet completely known. In other patient populations (e.g. the cervical cancer population)  there has been observed reductions in morbidity with SLN biopsy compared to pelvic lymphadenectomy. Lymphedema, nerve dysfunction and lymphocysts are all potential risks with the SLN technique as with complete lymphadenectomy. Additional risks to the patient include the risk of damage to an internal organ while operating in an altered view (e.g. the black and white image of the robotic fluorescence imaging mode).   We discussed the real possibility that in the setting of her weight she may not tolerate positioning for robotic surgery. If this is the case, then surgery would be aborted and plan would be for [facility] and Mirena IUD insertion. I reviewed the role of progestin therapy in the setting of endometrial hyperplasia and malignancy treatment. The goal would be to work aggressively towards weight loss while treating cancer with progestin.   We discussed the plan for a robotic assisted hysterectomy, bilateral salpingo-oophorectomy, sentinel lymph node evaluation, possible lymph node dissection, possible laparotomy, possible D&C and Mirena IUD insertion.   The patient has had a prior umbilical hernia repair with mesh. Plan will be to avoid mesh.    The risks of surgery were discussed in detail and she understands these to include infection; wound separation; hernia; vaginal cuff separation, injury to adjacent organs such as bowel, bladder, blood vessels, ureters and nerves; bleeding which may require blood transfusion; anesthesia risk; thromboembolic events; possible death; unforeseen complications; possible need for re-exploration; medical complications such as heart attack, stroke, pleural effusion and pneumonia; and, if full lymphadenectomy is performed the risk of lymphedema and lymphocyst. The patient will receive DVT and antibiotic prophylaxis as indicated. She voiced a clear understanding. She had the opportunity to ask questions. Perioperative instructions were reviewed with her. Prescriptions for  post-op medications were sent to her  pharmacy of choice.  Will reach out to MD who prescribes pain medication prior to prescribing additional narcotics for the postoperative period.   A copy of this note was sent to the patient's referring provider.   70 minutes of total time was spent for this patient encounter, including preparation, face-to-face counseling with the patient and coordination of care, and documentation of the encounter.  Jeral Pinch, MD  Division of Gynecologic Oncology  Department of Obstetrics and Gynecology  [facility]  ___________________________________________  Chief Complaint: Chief Complaint  Patient presents with   Endometrial cancer, grade I ([place])    History of Present Illness:  Claire Arnold is a 61 y.o. y.o. female who is seen in consultation at the request of Dr. Willis Modena for an evaluation of newly diagnosed uterine cancer.  She went through menopause in her early 65s.  She got her first COVID-vaccine in late 2020, started spotting after that.  Bleeding increased intermittently and she began having heavier bleeding with passage of clots in August 2022. She had several episodes where she bled through her clothing.  She notes some back cramping and pain with her bleeding episodes.  Pelvic ultrasound exam performed on 12/27 revealed uterus measuring 11 cm with diffuse thickening of the endometrium up to 3.3 cm with 2 smaller cystic areas within the inferior cavity.  Ovaries not visualized, no free fluid.    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  She has been on disability since 2018 after working for years at the [employer].  This was secondary to chronic back and leg pain, some of which started after surgery on her left leg.  She endorses appetite has been up and down, started Ozempic 3 weeks ago.  She denies any nausea or  emesis.  She reports regular bowel and bladder function.  Since starting Ozempic, she stopped metformin.  She has a history of depression.  She was told that she had a heart murmur that was discovered during cardiac work-up prior to her lap band surgery.  In addition to her Lap-Band procedure, she was performed where than 10 years ago laparoscopically, she had mesh placed for an umbilical hernia at that time and has a history of 1 prior C-section.  For her chronic back and leg pain, she uses 10 mg of oxycodone as well as 10 mg of Flexeril 3 times daily as needed.  She also uses Tylenol extra strength as needed.  PAST MEDICAL HISTORY:  Past Medical History:  Diagnosis Date   Anxiety    Depression    Hyperlipidemia    Hypertension    Obesity, morbid, BMI 50 or higher ([place])    Type 2 diabetes mellitus ([place])      PAST SURGICAL HISTORY:  Past Surgical History:  Procedure Laterality Date   CESAREAN SECTION  11/13/1990   LAPAROSCOPIC GASTRIC BANDING     LIGAMENT REPAIR Right    right wrist   REPLACEMENT TOTAL KNEE Left 2000    OB/GYN HISTORY:  OB History  Gravida Para Term Preterm AB Living  1 1   1   1   SAB IAB Ectopic Multiple Live Births               # Outcome Date GA Lbr Len/2nd Weight Sex Delivery Anes PTL Lv  1 Preterm             No LMP recorded. (Menstrual status: Perimenopausal).  Age at menarche: 26 Age at menopause: early 17s Hx of HRT: Denies Hx of STDs: Denies Last pap: 04/22/2020 History of abnormal pap smears: Denies  SCREENING STUDIES:  Last mammogram: 02/2021  Last colonoscopy: Has never had  MEDICATIONS: Outpatient Encounter Medications as of 05/07/2021  Medication Sig   cyclobenzaprine (FLEXERIL) 10 MG tablet Take 10 mg by mouth 3 (three) times daily as needed for muscle spasms.   DULoxetine (CYMBALTA) 60 MG capsule Take 120 mg by mouth daily.   losartan (COZAAR) 100 MG tablet Take 100 mg by mouth daily.   Oxycodone HCl 10 MG TABS Take 10 mg by mouth  3 (three) times daily as needed (pain).   OZEMPIC, 0.25 OR 0.5 MG/DOSE, 2 MG/1.5ML SOPN Inject 0.25 mg into the skin every Wednesday.   senna-docusate (SENOKOT-S) 8.6-50 MG tablet Take 2 tablets by mouth at bedtime. For AFTER surgery, do not take if having diarrhea   No facility-administered encounter medications on file as of 05/07/2021.    ALLERGIES:  No Known Allergies   FAMILY HISTORY:  Family History  Problem Relation Age of Onset   Cancer Father        Gallbladder Cancer   Liver cancer Brother    Breast cancer Cousin    Colon cancer Neg Hx    Ovarian cancer Neg Hx    Endometrial cancer Neg Hx    Pancreatic cancer Neg Hx    Prostate cancer Neg Hx      SOCIAL HISTORY:  Social Connections: Not on file    REVIEW OF SYSTEMS:  Pertinent positives include decreased appetite, menstrual problems, vaginal bleeding. Denies fevers, chills, fatigue, unexplained weight changes. Denies hearing loss, neck lumps or masses, mouth sores, ringing in ears or voice changes. Denies cough or wheezing.  Denies shortness of breath. Denies chest pain or palpitations. Denies leg swelling. Denies abdominal distention, pain, blood in stools, constipation, diarrhea, nausea, vomiting, or early satiety. Denies pain with intercourse, dysuria, frequency, hematuria or incontinence. Denies hot flashes, pelvic pain or vaginal discharge.   Denies joint pain, back pain or muscle pain/cramps. Denies itching, rash, or wounds. Denies dizziness, headaches, numbness or seizures. Denies swollen lymph nodes or glands, denies easy bruising or bleeding. Denies anxiety, depression, confusion, or decreased concentration.  Physical Exam:  Vital Signs for this encounter:  Blood pressure (!) 159/93, pulse 99, temperature 98.4 F (36.9 C), temperature source Oral, resp. rate 18, height 5' 2.21" (1.58 m), weight (!) 313 lb 3.2 oz (142.1 kg), SpO2 93 %. Body mass index is 56.91 kg/m. General: Alert, oriented, no acute  distress.  HEENT: Normocephalic, atraumatic. Sclera anicteric.  Chest: Clear to auscultation bilaterally. No wheezes, rhonchi, or rales. Cardiovascular: Regular rate and rhythm, no murmurs, rubs, or gallops.  Abdomen: Obese. Normoactive bowel sounds. Soft, nondistended, nontender to palpation. No masses or hepatosplenomegaly appreciated. No palpable fluid wave. Small umbilical hernia. Extremities: Grossly normal range of motion. Warm, well perfused. No edema bilaterally.  Skin: No rashes or lesions.  Lymphatics: No cervical, supraclavicular, or inguinal adenopathy.  GU:  Normal external female genitalia.  No lesions. No discharge or bleeding.             Bladder/urethra:  No lesions or masses, well supported bladder             Vagina: No lesions or masses.             Cervix: Normal appearing, no lesions. Minimal descensus.  Uterus: Exam limited by body habitus. Small, mobile, no parametrial involvement or nodularity.             Adnexa: No masses appreciated.  Rectal: Deferred.  LABORATORY AND RADIOLOGIC DATA:  Outside medical records were reviewed to synthesize the above history, along with the history and physical obtained during the visit.   No results found for: WBC, HGB, HCT, PLT, GLUCOSE, CHOL, TRIG, HDL, LDLDIRECT, LDLCALC, ALT, AST, NA, K, CL, CREATININE, BUN, CO2, TSH, PSA, INR, GLUF, HGBA1C, MICROALBUR  Pelvic ultrasound 04/17/21: IMPRESSION: 1. Abnormal endometrial thickening measuring 3.3 cm. No focal mass identified. In the setting of post-menopausal bleeding, endometrial sampling is indicated to exclude carcinoma. If results are benign, sonohysterogram should be considered for focal lesion work-up. (Ref: Radiological Reasoning: Algorithmic Workup of Abnormal Vaginal Bleeding with Endovaginal Sonography and Sonohysterography. AJR 2008; 269:S85-46) 2. Nonvisualization of the bilateral ovaries.

## 2021-05-06 NOTE — H&P (View-Only) (Signed)
GYNECOLOGIC ONCOLOGY NEW PATIENT CONSULTATION   Patient Name: Claire Arnold  Patient Age: 61 y.o. Date of Service: 05/07/21 Referring Provider: Dr. Cheri Fowler  Primary Care Provider: Fanny Bien, MD Consulting Provider: Jeral Pinch, MD   Assessment/Plan:  Postmenopausal patient with clinical stage I endometrial cancer.   We reviewed the nature of endometrial cancer and its recommended surgical staging, including total hysterectomy, bilateral salpingo-oophorectomy, and lymph node assessment. The patient is a suitable candidate for attempt at staging via a minimally invasive approach to surgery.  We reviewed that robotic assistance would be used to complete the surgery.   We discussed that most endometrial cancer is detected early and that decisions regarding adjuvant therapy will be made based on her final pathology.   We reviewed the sentinel lymph node technique. Risks and benefits of sentinel lymph node biopsy was reviewed. We reviewed the technique and ICG dye. The patient DOES NOT have an iodine allergy or known liver dysfunction. We reviewed the false negative rate (0.4%), and that 3% of patients with metastatic disease will not have it detected by SLN biopsy in endometrial cancer. A low risk of allergic reaction to the dye, <0.2% for ICG, has been reported. We also discussed that in the case of failed mapping, which occurs 40% of the time, a bilateral or unilateral lymphadenectomy will be performed at the surgeons discretion.   Potential benefits of sentinel nodes including a higher detection rate for metastasis due to ultrastaging and potential reduction in operative morbidity. However, there remains uncertainty as to the role for treatment of micrometastatic disease. Further, the benefit of operative morbidity associated with the SLN technique in endometrial cancer is not yet completely known. In other patient populations (e.g. the cervical cancer population)  there has been observed reductions in morbidity with SLN biopsy compared to pelvic lymphadenectomy. Lymphedema, nerve dysfunction and lymphocysts are all potential risks with the SLN technique as with complete lymphadenectomy. Additional risks to the patient include the risk of damage to an internal organ while operating in an altered view (e.g. the black and white image of the robotic fluorescence imaging mode).   We discussed the real possibility that in the setting of her weight she may not tolerate positioning for robotic surgery. If this is the case, then surgery would be aborted and plan would be for [facility] and Mirena IUD insertion. I reviewed the role of progestin therapy in the setting of endometrial hyperplasia and malignancy treatment. The goal would be to work aggressively towards weight loss while treating cancer with progestin.   We discussed the plan for a robotic assisted hysterectomy, bilateral salpingo-oophorectomy, sentinel lymph node evaluation, possible lymph node dissection, possible laparotomy, possible D&C and Mirena IUD insertion.   The patient has had a prior umbilical hernia repair with mesh. Plan will be to avoid mesh.    The risks of surgery were discussed in detail and she understands these to include infection; wound separation; hernia; vaginal cuff separation, injury to adjacent organs such as bowel, bladder, blood vessels, ureters and nerves; bleeding which may require blood transfusion; anesthesia risk; thromboembolic events; possible death; unforeseen complications; possible need for re-exploration; medical complications such as heart attack, stroke, pleural effusion and pneumonia; and, if full lymphadenectomy is performed the risk of lymphedema and lymphocyst. The patient will receive DVT and antibiotic prophylaxis as indicated. She voiced a clear understanding. She had the opportunity to ask questions. Perioperative instructions were reviewed with her. Prescriptions for  post-op medications were sent to her  pharmacy of choice.  Will reach out to MD who prescribes pain medication prior to prescribing additional narcotics for the postoperative period.   A copy of this note was sent to the patient's referring provider.   70 minutes of total time was spent for this patient encounter, including preparation, face-to-face counseling with the patient and coordination of care, and documentation of the encounter.  Jeral Pinch, MD  Division of Gynecologic Oncology  Department of Obstetrics and Gynecology  [facility]  ___________________________________________  Chief Complaint: Chief Complaint  Patient presents with   Endometrial cancer, grade I ([place])    History of Present Illness:  Claire Arnold is a 61 y.o. y.o. female who is seen in consultation at the request of Dr. Willis Modena for an evaluation of newly diagnosed uterine cancer.  She went through menopause in her early 7s.  She got her first COVID-vaccine in late 2020, started spotting after that.  Bleeding increased intermittently and she began having heavier bleeding with passage of clots in August 2022. She had several episodes where she bled through her clothing.  She notes some back cramping and pain with her bleeding episodes.  Pelvic ultrasound exam performed on 12/27 revealed uterus measuring 11 cm with diffuse thickening of the endometrium up to 3.3 cm with 2 smaller cystic areas within the inferior cavity.  Ovaries not visualized, no free fluid.    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  She has been on disability since 2018 after working for years at the [employer].  This was secondary to chronic back and leg pain, some of which started after surgery on her left leg.  She endorses appetite has been up and down, started Ozempic 3 weeks ago.  She denies any nausea or  emesis.  She reports regular bowel and bladder function.  Since starting Ozempic, she stopped metformin.  She has a history of depression.  She was told that she had a heart murmur that was discovered during cardiac work-up prior to her lap band surgery.  In addition to her Lap-Band procedure, she was performed where than 10 years ago laparoscopically, she had mesh placed for an umbilical hernia at that time and has a history of 1 prior C-section.  For her chronic back and leg pain, she uses 10 mg of oxycodone as well as 10 mg of Flexeril 3 times daily as needed.  She also uses Tylenol extra strength as needed.  PAST MEDICAL HISTORY:  Past Medical History:  Diagnosis Date   Anxiety    Depression    Hyperlipidemia    Hypertension    Obesity, morbid, BMI 50 or higher ([place])    Type 2 diabetes mellitus ([place])      PAST SURGICAL HISTORY:  Past Surgical History:  Procedure Laterality Date   CESAREAN SECTION  11/13/1990   LAPAROSCOPIC GASTRIC BANDING     LIGAMENT REPAIR Right    right wrist   REPLACEMENT TOTAL KNEE Left 2000    OB/GYN HISTORY:  OB History  Gravida Para Term Preterm AB Living  1 1   1   1   SAB IAB Ectopic Multiple Live Births               # Outcome Date GA Lbr Len/2nd Weight Sex Delivery Anes PTL Lv  1 Preterm             No LMP recorded. (Menstrual status: Perimenopausal).  Age at menarche: 40 Age at menopause: early 56s Hx of HRT: Denies Hx of STDs: Denies Last pap: 04/22/2020 History of abnormal pap smears: Denies  SCREENING STUDIES:  Last mammogram: 02/2021  Last colonoscopy: Has never had  MEDICATIONS: Outpatient Encounter Medications as of 05/07/2021  Medication Sig   cyclobenzaprine (FLEXERIL) 10 MG tablet Take 10 mg by mouth 3 (three) times daily as needed for muscle spasms.   DULoxetine (CYMBALTA) 60 MG capsule Take 120 mg by mouth daily.   losartan (COZAAR) 100 MG tablet Take 100 mg by mouth daily.   Oxycodone HCl 10 MG TABS Take 10 mg by mouth  3 (three) times daily as needed (pain).   OZEMPIC, 0.25 OR 0.5 MG/DOSE, 2 MG/1.5ML SOPN Inject 0.25 mg into the skin every Wednesday.   senna-docusate (SENOKOT-S) 8.6-50 MG tablet Take 2 tablets by mouth at bedtime. For AFTER surgery, do not take if having diarrhea   No facility-administered encounter medications on file as of 05/07/2021.    ALLERGIES:  No Known Allergies   FAMILY HISTORY:  Family History  Problem Relation Age of Onset   Cancer Father        Gallbladder Cancer   Liver cancer Brother    Breast cancer Cousin    Colon cancer Neg Hx    Ovarian cancer Neg Hx    Endometrial cancer Neg Hx    Pancreatic cancer Neg Hx    Prostate cancer Neg Hx      SOCIAL HISTORY:  Social Connections: Not on file    REVIEW OF SYSTEMS:  Pertinent positives include decreased appetite, menstrual problems, vaginal bleeding. Denies fevers, chills, fatigue, unexplained weight changes. Denies hearing loss, neck lumps or masses, mouth sores, ringing in ears or voice changes. Denies cough or wheezing.  Denies shortness of breath. Denies chest pain or palpitations. Denies leg swelling. Denies abdominal distention, pain, blood in stools, constipation, diarrhea, nausea, vomiting, or early satiety. Denies pain with intercourse, dysuria, frequency, hematuria or incontinence. Denies hot flashes, pelvic pain or vaginal discharge.   Denies joint pain, back pain or muscle pain/cramps. Denies itching, rash, or wounds. Denies dizziness, headaches, numbness or seizures. Denies swollen lymph nodes or glands, denies easy bruising or bleeding. Denies anxiety, depression, confusion, or decreased concentration.  Physical Exam:  Vital Signs for this encounter:  Blood pressure (!) 159/93, pulse 99, temperature 98.4 F (36.9 C), temperature source Oral, resp. rate 18, height 5' 2.21" (1.58 m), weight (!) 313 lb 3.2 oz (142.1 kg), SpO2 93 %. Body mass index is 56.91 kg/m. General: Alert, oriented, no acute  distress.  HEENT: Normocephalic, atraumatic. Sclera anicteric.  Chest: Clear to auscultation bilaterally. No wheezes, rhonchi, or rales. Cardiovascular: Regular rate and rhythm, no murmurs, rubs, or gallops.  Abdomen: Obese. Normoactive bowel sounds. Soft, nondistended, nontender to palpation. No masses or hepatosplenomegaly appreciated. No palpable fluid wave. Small umbilical hernia. Extremities: Grossly normal range of motion. Warm, well perfused. No edema bilaterally.  Skin: No rashes or lesions.  Lymphatics: No cervical, supraclavicular, or inguinal adenopathy.  GU:  Normal external female genitalia.  No lesions. No discharge or bleeding.             Bladder/urethra:  No lesions or masses, well supported bladder             Vagina: No lesions or masses.             Cervix: Normal appearing, no lesions. Minimal descensus.  Uterus: Exam limited by body habitus. Small, mobile, no parametrial involvement or nodularity.             Adnexa: No masses appreciated.  Rectal: Deferred.  LABORATORY AND RADIOLOGIC DATA:  Outside medical records were reviewed to synthesize the above history, along with the history and physical obtained during the visit.   No results found for: WBC, HGB, HCT, PLT, GLUCOSE, CHOL, TRIG, HDL, LDLDIRECT, LDLCALC, ALT, AST, NA, K, CL, CREATININE, BUN, CO2, TSH, PSA, INR, GLUF, HGBA1C, MICROALBUR  Pelvic ultrasound 04/17/21: IMPRESSION: 1. Abnormal endometrial thickening measuring 3.3 cm. No focal mass identified. In the setting of post-menopausal bleeding, endometrial sampling is indicated to exclude carcinoma. If results are benign, sonohysterogram should be considered for focal lesion work-up. (Ref: Radiological Reasoning: Algorithmic Workup of Abnormal Vaginal Bleeding with Endovaginal Sonography and Sonohysterography. AJR 2008; 440:N02-72) 2. Nonvisualization of the bilateral ovaries.

## 2021-05-07 ENCOUNTER — Inpatient Hospital Stay: Payer: Medicare Other | Attending: Gynecologic Oncology | Admitting: Gynecologic Oncology

## 2021-05-07 ENCOUNTER — Inpatient Hospital Stay (HOSPITAL_BASED_OUTPATIENT_CLINIC_OR_DEPARTMENT_OTHER): Payer: Medicare Other | Admitting: Gynecologic Oncology

## 2021-05-07 ENCOUNTER — Other Ambulatory Visit: Payer: Self-pay

## 2021-05-07 ENCOUNTER — Encounter: Payer: Self-pay | Admitting: Gynecologic Oncology

## 2021-05-07 VITALS — BP 159/93 | HR 99 | Temp 98.4°F | Resp 18 | Ht 62.21 in | Wt 313.2 lb

## 2021-05-07 DIAGNOSIS — C541 Malignant neoplasm of endometrium: Secondary | ICD-10-CM

## 2021-05-07 DIAGNOSIS — Z79891 Long term (current) use of opiate analgesic: Secondary | ICD-10-CM | POA: Insufficient documentation

## 2021-05-07 DIAGNOSIS — M549 Dorsalgia, unspecified: Secondary | ICD-10-CM | POA: Insufficient documentation

## 2021-05-07 DIAGNOSIS — Z79899 Other long term (current) drug therapy: Secondary | ICD-10-CM | POA: Diagnosis not present

## 2021-05-07 DIAGNOSIS — F32A Depression, unspecified: Secondary | ICD-10-CM | POA: Insufficient documentation

## 2021-05-07 DIAGNOSIS — Z6841 Body Mass Index (BMI) 40.0 and over, adult: Secondary | ICD-10-CM | POA: Diagnosis not present

## 2021-05-07 DIAGNOSIS — K429 Umbilical hernia without obstruction or gangrene: Secondary | ICD-10-CM | POA: Insufficient documentation

## 2021-05-07 DIAGNOSIS — Z90710 Acquired absence of both cervix and uterus: Secondary | ICD-10-CM | POA: Insufficient documentation

## 2021-05-07 DIAGNOSIS — E119 Type 2 diabetes mellitus without complications: Secondary | ICD-10-CM | POA: Diagnosis not present

## 2021-05-07 DIAGNOSIS — I10 Essential (primary) hypertension: Secondary | ICD-10-CM | POA: Diagnosis not present

## 2021-05-07 DIAGNOSIS — G8929 Other chronic pain: Secondary | ICD-10-CM | POA: Insufficient documentation

## 2021-05-07 DIAGNOSIS — Z90722 Acquired absence of ovaries, bilateral: Secondary | ICD-10-CM | POA: Diagnosis not present

## 2021-05-07 DIAGNOSIS — E785 Hyperlipidemia, unspecified: Secondary | ICD-10-CM | POA: Insufficient documentation

## 2021-05-07 DIAGNOSIS — M79606 Pain in leg, unspecified: Secondary | ICD-10-CM | POA: Diagnosis not present

## 2021-05-07 DIAGNOSIS — F419 Anxiety disorder, unspecified: Secondary | ICD-10-CM | POA: Diagnosis not present

## 2021-05-07 MED ORDER — SENNOSIDES-DOCUSATE SODIUM 8.6-50 MG PO TABS: 2.0000 | ORAL_TABLET | Freq: Every day | ORAL | 0 refills | Status: DC

## 2021-05-07 NOTE — Patient Instructions (Addendum)
Preparing for your Surgery  Plan for surgery on May 16, 2021 with Dr. Jeral Pinch at [place] will be scheduled for robotic assisted total laparoscopic hysterectomy (removal of the uterus and cervix), bilateral salpingo-oophorectomy (removal of both ovaries and fallopian tubes), sentinel lymph node biopsy, possible lymph node dissection, possible laparotomy (larger incision on your abdomen if needed), possible dilation and curettage of the uterus with Mirena IUD placement.   Pre-operative Testing -You will receive a phone call from presurgical testing at [facility] to arrange for a pre-operative appointment and lab work.  -Bring your insurance card, copy of an advanced directive if applicable, medication list  -At that visit, you will be asked to sign a consent for a possible blood transfusion in case a transfusion becomes necessary during surgery.  The need for a blood transfusion is rare but having consent is a necessary part of your care.     -You should not be taking blood thinners or aspirin at least ten days prior to surgery unless instructed by your surgeon.  -Do not take supplements such as fish oil (omega 3), red yeast rice, turmeric before your surgery. You want to avoid medications with aspirin in them including headache powders such as BC or Goody's), Excedrin migraine.  Day Before Surgery at [place] will be asked to take in a light diet the day before surgery. You will be advised you can have clear liquids up until 3 hours before your surgery.    Eat a light diet the day before surgery.  Examples including soups, broths, toast, yogurt, mashed potatoes.  AVOID GAS PRODUCING FOODS. Things to avoid include carbonated beverages (fizzy beverages, sodas), raw fruits and raw vegetables (uncooked), or beans.   If your bowels are filled with gas, your surgeon will have difficulty visualizing your pelvic organs which increases your surgical risks.  Your  role in recovery Your role is to become active as soon as directed by your doctor, while still giving yourself time to heal.  Rest when you feel tired. You will be asked to do the following in order to speed your recovery:  - Cough and breathe deeply. This helps to clear and expand your lungs and can prevent pneumonia after surgery.  - [place]. Do mild physical activity. Walking or moving your legs help your circulation and body functions return to normal. Do not try to get up or walk alone the first time after surgery.   -If you develop swelling on one leg or the other, pain in the back of your leg, redness/warmth in one of your legs, please call the office or go to the Emergency Room to have a doppler to rule out a blood clot. For shortness of breath, chest pain-seek care in the Emergency Room as soon as possible. - Actively manage your pain. Managing your pain lets you move in comfort. We will ask you to rate your pain on a scale of zero to 10. It is your responsibility to tell your doctor or nurse where and how much you hurt so your pain can be treated.  Special Considerations -If you are diabetic, you may be placed on insulin after surgery to have closer control over your blood sugars to promote healing and recovery.  This does not mean that you will be discharged on insulin.  If applicable, your oral antidiabetics will be resumed when you are tolerating a solid diet.  -Your final pathology results from surgery should  be available around one week after surgery and the results will be relayed to you when available.  -Dr.  is the surgeon that assists your GYN Oncologist with surgery.  If you end up staying the night, the next day after your surgery you will either see Dr. Berline Lopes or Dr. Lahoma Crocker.  -FMLA forms can be faxed to [phone] and please allow 5-7 business days for completion.  Pain Management After Surgery -Please let our office know once you have  reached out to your orthopedist about additional pain medication prescribed by our office for upcoming surgery.   -Make sure that you have Tylenol at home to use on a regular basis after surgery for pain control.   -Review the attached handout on narcotic use and their risks and side effects.   Bowel Regimen -You have been prescribed Sennakot-S to take nightly to prevent constipation especially if you are taking the narcotic pain medication intermittently.  It is important to prevent constipation and drink adequate amounts of liquids. You can stop taking this medication when you are not taking pain medication and you are back on your normal bowel routine.  Risks of Surgery Risks of surgery are low but include bleeding, infection, damage to surrounding structures, re-operation, blood clots, and very rarely death.  Blood Transfusion Information (For the consent to be signed before surgery)  We will be checking your blood type before surgery so in case of emergencies, we will know what type of blood you would need.                                            WHAT IS A BLOOD TRANSFUSION?  A transfusion is the replacement of blood or some of its parts. Blood is made up of multiple cells which provide different functions. Red blood cells carry oxygen and are used for blood loss replacement. White blood cells fight against infection. Platelets control bleeding. Plasma helps clot blood. Other blood products are available for specialized needs, such as hemophilia or other clotting disorders. BEFORE THE TRANSFUSION  Who gives blood for transfusions?  You may be able to donate blood to be used at a later date on yourself (autologous donation). Relatives can be asked to donate blood. This is generally not any safer than if you have received blood from a stranger. The same precautions are taken to ensure safety when a relative's blood is donated. Healthy volunteers who are fully evaluated to make sure  their blood is safe. This is blood bank blood. Transfusion therapy is the safest it has ever been in the practice of medicine. Before blood is taken from a donor, a complete history is taken to make sure that person has no history of diseases nor engages in risky social behavior (examples are intravenous drug use or sexual activity with multiple partners). The donor's travel history is screened to minimize risk of transmitting infections, such as malaria. The donated blood is tested for signs of infectious diseases, such as HIV and hepatitis. The blood is then tested to be sure it is compatible with you in order to minimize the chance of a transfusion reaction. If you or a relative donates blood, this is often done in anticipation of surgery and is not appropriate for emergency situations. It takes many days to process the donated blood. RISKS AND COMPLICATIONS Although transfusion therapy is very safe and saves  many lives, the main dangers of transfusion include:  Getting an infectious disease. Developing a transfusion reaction. This is an allergic reaction to something in the blood you were given. Every precaution is taken to prevent this. The decision to have a blood transfusion has been considered carefully by your caregiver before blood is given. Blood is not given unless the benefits outweigh the risks.  AFTER SURGERY INSTRUCTIONS  Return to work: 4-6 weeks if applicable  Activity: 1. Be up and out of the bed during the day.  Take a nap if needed.  You may walk up steps but be careful and use the hand rail.  Stair climbing will tire you more than you think, you may need to stop part way and rest.   2. No lifting or straining for 6 weeks over 10 pounds. No pushing, pulling, straining for 6 weeks.  3. No driving for 1 week(s).  Do not drive if you are taking narcotic pain medicine and make sure that your reaction time has returned.   4. You can shower as soon as the next day after surgery.  Shower daily.  Use your regular soap and water (not directly on the incision) and pat your incision(s) dry afterwards; don't rub.  No tub baths or submerging your body in water until cleared by your surgeon. If you have the soap that was given to you by pre-surgical testing that was used before surgery, you do not need to use it afterwards because this can irritate your incisions.   5. No sexual activity and nothing in the vagina for 8 weeks.  6. You may experience a small amount of clear drainage from your incisions, which is normal.  If the drainage persists, increases, or changes color please call the office.  7. Do not use creams, lotions, or ointments such as neosporin on your incisions after surgery until advised by your surgeon because they can cause removal of the dermabond glue on your incisions.    8. You may experience vaginal spotting after surgery or around the 6-8 week mark from surgery when the stitches at the top of the vagina begin to dissolve.  The spotting is normal but if you experience heavy bleeding, call our office.  9. Take Tylenol first for pain and only use narcotic pain medication for severe pain not relieved by the Tylenol.  Monitor your Tylenol intake to a max of 4,000 mg in a 24 hour period.  Diet: 1. Low sodium Heart Healthy Diet is recommended but you are cleared to resume your normal (before surgery) diet after your procedure.  2. It is safe to use a laxative, such as Miralax or Colace, if you have difficulty moving your bowels. You have been prescribed Sennakot-S to take at bedtime every evening after surgery to keep bowel movements regular and to prevent constipation.    Wound Care: 1. Keep clean and dry.  Shower daily.  Reasons to call the Doctor: Fever - Oral temperature greater than 100.4 degrees Fahrenheit Foul-smelling vaginal discharge Difficulty urinating Nausea and vomiting Increased pain at the site of the incision that is unrelieved with pain  medicine. Difficulty breathing with or without chest pain New calf pain especially if only on one side Sudden, continuing increased vaginal bleeding with or without clots.   Contacts: For questions or concerns you should contact:  Dr. Jeral Pinch at [phone]  Joylene John, NP at [phone]  After Hours: call [phone] and have the GYN Oncologist paged/contacted (after 5 pm or  on the weekends).  Messages sent via mychart are for non-urgent matters and are not responded to after hours so for urgent needs, please call the after hours number.

## 2021-05-08 NOTE — Patient Instructions (Signed)
Preparing for your Surgery   Plan for surgery on May 16, 2021 with Dr. Jeral Pinch at [place] will be scheduled for robotic assisted total laparoscopic hysterectomy (removal of the uterus and cervix), bilateral salpingo-oophorectomy (removal of both ovaries and fallopian tubes), sentinel lymph node biopsy, possible lymph node dissection, possible laparotomy (larger incision on your abdomen if needed), possible dilation and curettage of the uterus with Mirena IUD placement.    Pre-operative Testing -You will receive a phone call from presurgical testing at [facility] to arrange for a pre-operative appointment and lab work.   -Bring your insurance card, copy of an advanced directive if applicable, medication list   -At that visit, you will be asked to sign a consent for a possible blood transfusion in case a transfusion becomes necessary during surgery.  The need for a blood transfusion is rare but having consent is a necessary part of your care.      -You should not be taking blood thinners or aspirin at least ten days prior to surgery unless instructed by your surgeon.   -Do not take supplements such as fish oil (omega 3), red yeast rice, turmeric before your surgery. You want to avoid medications with aspirin in them including headache powders such as BC or Goody's), Excedrin migraine.   Day Before Surgery at [place] will be asked to take in a light diet the day before surgery. You will be advised you can have clear liquids up until 3 hours before your surgery.     Eat a light diet the day before surgery.  Examples including soups, broths, toast, yogurt, mashed potatoes.  AVOID GAS PRODUCING FOODS. Things to avoid include carbonated beverages (fizzy beverages, sodas), raw fruits and raw vegetables (uncooked), or beans.    If your bowels are filled with gas, your surgeon will have difficulty visualizing your pelvic organs which increases your surgical  risks.   Your role in recovery Your role is to become active as soon as directed by your doctor, while still giving yourself time to heal.  Rest when you feel tired. You will be asked to do the following in order to speed your recovery:   - Cough and breathe deeply. This helps to clear and expand your lungs and can prevent pneumonia after surgery.  - [place]. Do mild physical activity. Walking or moving your legs help your circulation and body functions return to normal. Do not try to get up or walk alone the first time after surgery.   -If you develop swelling on one leg or the other, pain in the back of your leg, redness/warmth in one of your legs, please call the office or go to the Emergency Room to have a doppler to rule out a blood clot. For shortness of breath, chest pain-seek care in the Emergency Room as soon as possible. - Actively manage your pain. Managing your pain lets you move in comfort. We will ask you to rate your pain on a scale of zero to 10. It is your responsibility to tell your doctor or nurse where and how much you hurt so your pain can be treated.   Special Considerations -If you are diabetic, you may be placed on insulin after surgery to have closer control over your blood sugars to promote healing and recovery.  This does not mean that you will be discharged on insulin.  If applicable, your oral antidiabetics will be resumed when you are  tolerating a solid diet.   -Your final pathology results from surgery should be available around one week after surgery and the results will be relayed to you when available.   -Dr.  is the surgeon that assists your GYN Oncologist with surgery.  If you end up staying the night, the next day after your surgery you will either see Dr. Berline Lopes or Dr. Lahoma Crocker.   -FMLA forms can be faxed to [phone] and please allow 5-7 business days for completion.   Pain Management After Surgery -Please let our office  know once you have reached out to your orthopedist about additional pain medication prescribed by our office for upcoming surgery.    -Make sure that you have Tylenol at home to use on a regular basis after surgery for pain control.    -Review the attached handout on narcotic use and their risks and side effects.    Bowel Regimen -You have been prescribed Sennakot-S to take nightly to prevent constipation especially if you are taking the narcotic pain medication intermittently.  It is important to prevent constipation and drink adequate amounts of liquids. You can stop taking this medication when you are not taking pain medication and you are back on your normal bowel routine.   Risks of Surgery Risks of surgery are low but include bleeding, infection, damage to surrounding structures, re-operation, blood clots, and very rarely death.   Blood Transfusion Information (For the consent to be signed before surgery)   We will be checking your blood type before surgery so in case of emergencies, we will know what type of blood you would need.                                             WHAT IS A BLOOD TRANSFUSION?   A transfusion is the replacement of blood or some of its parts. Blood is made up of multiple cells which provide different functions. Red blood cells carry oxygen and are used for blood loss replacement. White blood cells fight against infection. Platelets control bleeding. Plasma helps clot blood. Other blood products are available for specialized needs, such as hemophilia or other clotting disorders. BEFORE THE TRANSFUSION  Who gives blood for transfusions?  You may be able to donate blood to be used at a later date on yourself (autologous donation). Relatives can be asked to donate blood. This is generally not any safer than if you have received blood from a stranger. The same precautions are taken to ensure safety when a relative's blood is donated. Healthy volunteers who are  fully evaluated to make sure their blood is safe. This is blood bank blood. Transfusion therapy is the safest it has ever been in the practice of medicine. Before blood is taken from a donor, a complete history is taken to make sure that person has no history of diseases nor engages in risky social behavior (examples are intravenous drug use or sexual activity with multiple partners). The donor's travel history is screened to minimize risk of transmitting infections, such as malaria. The donated blood is tested for signs of infectious diseases, such as HIV and hepatitis. The blood is then tested to be sure it is compatible with you in order to minimize the chance of a transfusion reaction. If you or a relative donates blood, this is often done in anticipation of surgery and is not  appropriate for emergency situations. It takes many days to process the donated blood. RISKS AND COMPLICATIONS Although transfusion therapy is very safe and saves many lives, the main dangers of transfusion include:  Getting an infectious disease. Developing a transfusion reaction. This is an allergic reaction to something in the blood you were given. Every precaution is taken to prevent this. The decision to have a blood transfusion has been considered carefully by your caregiver before blood is given. Blood is not given unless the benefits outweigh the risks.   AFTER SURGERY INSTRUCTIONS   Return to work: 4-6 weeks if applicable   Activity: 1. Be up and out of the bed during the day.  Take a nap if needed.  You may walk up steps but be careful and use the hand rail.  Stair climbing will tire you more than you think, you may need to stop part way and rest.    2. No lifting or straining for 6 weeks over 10 pounds. No pushing, pulling, straining for 6 weeks.   3. No driving for 1 week(s).  Do not drive if you are taking narcotic pain medicine and make sure that your reaction time has returned.    4. You can shower as soon  as the next day after surgery. Shower daily.  Use your regular soap and water (not directly on the incision) and pat your incision(s) dry afterwards; don't rub.  No tub baths or submerging your body in water until cleared by your surgeon. If you have the soap that was given to you by pre-surgical testing that was used before surgery, you do not need to use it afterwards because this can irritate your incisions.    5. No sexual activity and nothing in the vagina for 8 weeks.   6. You may experience a small amount of clear drainage from your incisions, which is normal.  If the drainage persists, increases, or changes color please call the office.   7. Do not use creams, lotions, or ointments such as neosporin on your incisions after surgery until advised by your surgeon because they can cause removal of the dermabond glue on your incisions.     8. You may experience vaginal spotting after surgery or around the 6-8 week mark from surgery when the stitches at the top of the vagina begin to dissolve.  The spotting is normal but if you experience heavy bleeding, call our office.   9. Take Tylenol first for pain and only use narcotic pain medication for severe pain not relieved by the Tylenol.  Monitor your Tylenol intake to a max of 4,000 mg in a 24 hour period.   Diet: 1. Low sodium Heart Healthy Diet is recommended but you are cleared to resume your normal (before surgery) diet after your procedure.   2. It is safe to use a laxative, such as Miralax or Colace, if you have difficulty moving your bowels. You have been prescribed Sennakot-S to take at bedtime every evening after surgery to keep bowel movements regular and to prevent constipation.     Wound Care: 1. Keep clean and dry.  Shower daily.   Reasons to call the Doctor: Fever - Oral temperature greater than 100.4 degrees Fahrenheit Foul-smelling vaginal discharge Difficulty urinating Nausea and vomiting Increased pain at the site of the  incision that is unrelieved with pain medicine. Difficulty breathing with or without chest pain New calf pain especially if only on one side Sudden, continuing increased vaginal bleeding with or  without clots.   Contacts: For questions or concerns you should contact:   Dr. Jeral Pinch at [phone]   Joylene John, NP at [phone]   After Hours: call [phone] and have the GYN Oncologist paged/contacted (after 5 pm or on the weekends).   Messages sent via mychart are for non-urgent matters and are not responded to after hours so for urgent needs, please call the after hours number.

## 2021-05-08 NOTE — Progress Notes (Signed)
Patient here with her husband for new patient consultation with Dr. Berline Lopes and for a pre-operative discussion prior to her scheduled surgery on May 16, 2021. She is scheduled for robotic assisted total laparoscopic hysterectomy, bilateral salpingo-oophorectomy, sentinel lymph node biopsy, possible lymph node dissection, possible laparotomy, possible dilation and curettage of the uterus with Mirena IUD placement.. The surgery was discussed in detail.  See after visit summary for additional details. Visual aids used to discuss items related to surgery including sequential compression stockings, foley catheter, IV pump, multi-modal pain regimen including tylenol, photo of the surgical robot, female reproductive system to discuss surgery in detail.      Discussed post-op pain management in detail including the aspects of the enhanced recovery pathway. She receives chronic pain medication from her physician at [facility]. She is going to reach out to their office to inform them she is having surgery and for recommendations for pain management after surgery. 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 We discussed the use of tylenol post-op and to monitor for a maximum of 4,000 mg in a 24 hour period.  Also prescribed sennakot to be used after surgery and to hold if having loose stools.  Discussed bowel regimen in detail.     Discussed the use of SCDs and measures to take at home to prevent DVT including frequent mobility.  Reportable signs and symptoms of DVT discussed. Post-operative instructions discussed and expectations for after surgery. Incisional care discussed as well including reportable signs and symptoms including erythema, drainage, wound separation.     10 minutes spent with the patient.  Verbalizing understanding of material discussed. No needs or concerns voiced at the end of the visit.   Advised patient and family to call for any needs.  Advised that her post-operative medications had been  prescribed and could be picked up at any time. She will update our office after speaking with her physician who manages her chronic pain meds.    This appointment is included in the global surgical bundle as pre-operative teaching and has no charge.

## 2021-05-09 NOTE — Progress Notes (Addendum)
PCP - Rachell Cipro MD Cardiologist - no  PPM/ICD -  Device Orders -  Rep Notified -   Chest x-ray -  EKG - 05-14-21 Stress Test -  ECHO -  Cardiac Cath -   Sleep Study -  CPAP -   Fasting Blood Sugar -  Checks Blood Sugar _____ times a day  Blood Thinner Instructions: Aspirin Instructions:  ERAS Protcol - PRE-SURGERY G2-   COVID TEST- N/A COVID vaccine -pfizer x 3   Activity--Able to walk a flight of stairs without SOB Anesthesia review: DM , HTN, BMI 55.80, Murmur worked up by Cardiology years ago in [place] said checked out fine  Patient denies shortness of breath, fever, cough and chest pain at PAT appointment   All instructions explained to the patient, with a verbal understanding of the material. Patient agrees to go over the instructions while at home for a better understanding. Patient also instructed to self quarantine after being tested for COVID-19. The opportunity to ask questions was provided.

## 2021-05-09 NOTE — Patient Instructions (Signed)
DUE TO COVID-19 ONLY ONE VISITOR IS ALLOWED TO COME WITH YOU AND STAY IN THE WAITING ROOM ONLY DURING PRE OP AND PROCEDURE DAY OF SURGERY.           Your procedure is scheduled on: 05-16-21   Report to [facility]  Entrance   Report to admitting at      1245  PM     Call this number if you have problems the morning of surgery [phone]   Remember: Eat a light diet the day before surgery.  Examples including soups, broths, toast, yogurt, mashed potatoes.  Things to avoid include carbonated beverages (fizzy beverages), raw fruits and raw vegetables, or beans. No food after midnight you may have clear liquids until 1200 pm the day of your surgery then nothing by mouth  If your bowels are filled with gas, your surgeon will have difficulty visualizing your pelvic organs which increases your surgical risks.     CLEAR LIQUID DIET                                                                    water Black Coffee and tea, regular and decaf No Creamer                            Plain Jell-O any favor except red or purple                                  Fruit ices (not with fruit pulp)                                      Iced Popsicles                                                                       Cranberry, grape and apple juices Sports drinks like Gatorade Lightly seasoned clear broth or consume(fat free) Sugar, honey       BRUSH YOUR TEETH MORNING OF SURGERY AND RINSE YOUR MOUTH OUT, NO CHEWING GUM CANDY OR MINTS.     Take these medicines the morning of surgery with A SIP OF WATER: cymbalta, oxycodone if needed  DO NOT TAKE ANY DIABETIC MEDICATIONS DAY OF YOUR SURGERY                               You may not have any metal on your body including hair pins and              piercings  Do not wear jewelry, make-up, lotions, powders,perfumes,        deodorant             Do not wear nail polish on your fingernails or toenails .  Do not  shave  48 hours prior  to surgery.             Do not bring valuables to the hospital. [facility].  Contacts, dentures or bridgework may not be worn into surgery.  You may bring a small overnight bag with you     Patients discharged the day of surgery will not be allowed to drive home. IF YOU ARE HAVING SURGERY AND GOING HOME THE SAME DAY, YOU MUST HAVE AN ADULT TO DRIVE YOU HOME AND BE WITH YOU FOR 24 HOURS. YOU MAY GO HOME BY TAXI OR UBER OR ORTHERWISE, BUT AN ADULT MUST ACCOMPANY YOU HOME AND STAY WITH YOU FOR 24 HOURS.  Name and phone number of your driver:  Special Instructions: N/A              Please read over the following fact sheets you were given: _____________________________________________________________________             [facility] - Preparing for Surgery Before surgery, you can play an important role.  Because skin is not sterile, your skin needs to be as free of germs as possible.  You can reduce the number of germs on your skin by washing with CHG (chlorahexidine gluconate) soap before surgery.  CHG is an antiseptic cleaner which kills germs and bonds with the skin to continue killing germs even after washing. Please DO NOT use if you have an allergy to CHG or antibacterial soaps.  If your skin becomes reddened/irritated stop using the CHG and inform your nurse when you arrive at Short Stay. Do not shave (including legs and underarms) for at least 48 hours prior to the first CHG shower.  You may shave your face/neck. Please follow these instructions carefully:  1.  Shower with CHG Soap the night before surgery and the  morning of Surgery.  2.  If you choose to wash your hair, wash your hair first as usual with your  normal  shampoo.  3.  After you shampoo, rinse your hair and body thoroughly to remove the  shampoo.                           4.  Use CHG as you would any other liquid soap.  You can apply chg directly  to the skin and wash                        Gently with a scrungie or clean washcloth.  5.  Apply the CHG Soap to your body ONLY FROM THE NECK DOWN.   Do not use on face/ open                           Wound or open sores. Avoid contact with eyes, ears mouth and genitals (private parts).                       Wash face,  Genitals (private parts) with your normal soap.             6.  Wash thoroughly, paying special attention to the area where your surgery  will be performed.  7.  Thoroughly rinse your body with warm water from the neck down.  8.  DO NOT shower/wash with  your normal soap after using and rinsing off  the CHG Soap.                9.  Pat yourself dry with a clean towel.            10.  Wear clean pajamas.            11.  Place clean sheets on your bed the night of your first shower and do not  sleep with pets. Day of Surgery : Do not apply any lotions/deodorants the morning of surgery.  Please wear clean clothes to the hospital/surgery center.  FAILURE TO FOLLOW THESE INSTRUCTIONS MAY RESULT IN THE CANCELLATION OF YOUR SURGERY PATIENT SIGNATURE_________________________________  NURSE SIGNATURE__________________________________  ________________________________________________________________________   WHAT IS A BLOOD TRANSFUSION? Blood Transfusion Information  A transfusion is the replacement of blood or some of its parts. Blood is made up of multiple cells which provide different functions. Red blood cells carry oxygen and are used for blood loss replacement. White blood cells fight against infection. Platelets control bleeding. Plasma helps clot blood. Other blood products are available for specialized needs, such as hemophilia or other clotting disorders. BEFORE THE TRANSFUSION  Who gives blood for transfusions?  Healthy volunteers who are fully evaluated to make sure their blood is safe. This is blood bank blood. Transfusion therapy is the safest it has ever been in the practice of medicine. Before  blood is taken from a donor, a complete history is taken to make sure that person has no history of diseases nor engages in risky social behavior (examples are intravenous drug use or sexual activity with multiple partners). The donor's travel history is screened to minimize risk of transmitting infections, such as malaria. The donated blood is tested for signs of infectious diseases, such as HIV and hepatitis. The blood is then tested to be sure it is compatible with you in order to minimize the chance of a transfusion reaction. If you or a relative donates blood, this is often done in anticipation of surgery and is not appropriate for emergency situations. It takes many days to process the donated blood. RISKS AND COMPLICATIONS Although transfusion therapy is very safe and saves many lives, the main dangers of transfusion include:  Getting an infectious disease. Developing a transfusion reaction. This is an allergic reaction to something in the blood you were given. Every precaution is taken to prevent this. The decision to have a blood transfusion has been considered carefully by your caregiver before blood is given. Blood is not given unless the benefits outweigh the risks. AFTER THE TRANSFUSION Right after receiving a blood transfusion, you will usually feel much better and more energetic. This is especially true if your red blood cells have gotten low (anemic). The transfusion raises the level of the red blood cells which carry oxygen, and this usually causes an energy increase. The nurse administering the transfusion will monitor you carefully for complications. HOME CARE INSTRUCTIONS  No special instructions are needed after a transfusion. You may find your energy is better. Speak with your caregiver about any limitations on activity for underlying diseases you may have. SEEK MEDICAL CARE IF:  Your condition is not improving after your transfusion. You develop redness or irritation at the  intravenous (IV) site. SEEK IMMEDIATE MEDICAL CARE IF:  Any of the following symptoms occur over the next 12 hours: Shaking chills. You have a temperature by mouth above 102 F (38.9 C), not controlled by medicine. Chest, back, or  muscle pain. People around you feel you are not acting correctly or are confused. Shortness of breath or difficulty breathing. Dizziness and fainting. You get a rash or develop hives. You have a decrease in urine output. Your urine turns a dark color or changes to pink, red, or brown. Any of the following symptoms occur over the next 10 days: You have a temperature by mouth above 102 F (38.9 C), not controlled by medicine. Shortness of breath. Weakness after normal activity. The white part of the eye turns yellow (jaundice). You have a decrease in the amount of urine or are urinating less often. Your urine turns a dark color or changes to pink, red, or brown. Document Released: 04/05/2000 Document Revised: 07/01/2011 Document Reviewed: 11/23/2007 [facility] Patient Information 2014 [place], [place].  _______________________________________________________________________

## 2021-05-11 DIAGNOSIS — C541 Malignant neoplasm of endometrium: Secondary | ICD-10-CM | POA: Diagnosis not present

## 2021-05-11 DIAGNOSIS — E1165 Type 2 diabetes mellitus with hyperglycemia: Secondary | ICD-10-CM | POA: Diagnosis not present

## 2021-05-14 ENCOUNTER — Encounter (HOSPITAL_COMMUNITY): Payer: Self-pay

## 2021-05-14 ENCOUNTER — Other Ambulatory Visit: Payer: Self-pay

## 2021-05-14 ENCOUNTER — Encounter (HOSPITAL_COMMUNITY)
Admission: RE | Admit: 2021-05-14 | Discharge: 2021-05-14 | Disposition: A | Discharge status: Home / Self Care | Payer: Medicare Other | Source: Ambulatory Visit | Attending: Gynecologic Oncology | Admitting: Gynecologic Oncology

## 2021-05-14 VITALS — BP 141/68 | HR 88 | Temp 98.9°F | Resp 17 | Ht 62.5 in | Wt 310.0 lb

## 2021-05-14 DIAGNOSIS — E119 Type 2 diabetes mellitus without complications: Secondary | ICD-10-CM | POA: Insufficient documentation

## 2021-05-14 DIAGNOSIS — Z01818 Encounter for other preprocedural examination: Secondary | ICD-10-CM | POA: Diagnosis not present

## 2021-05-14 DIAGNOSIS — C541 Malignant neoplasm of endometrium: Secondary | ICD-10-CM | POA: Diagnosis not present

## 2021-05-14 HISTORY — DX: Unspecified osteoarthritis, unspecified site: M19.90

## 2021-05-14 HISTORY — DX: Malignant (primary) neoplasm, unspecified: C80.1

## 2021-05-14 HISTORY — DX: Personal history of other diseases of the digestive system: Z87.19

## 2021-05-14 HISTORY — DX: Pneumonia, unspecified organism: J18.9

## 2021-05-14 HISTORY — DX: Cardiac murmur, unspecified: R01.1

## 2021-05-14 LAB — COMPREHENSIVE METABOLIC PANEL
ALT: 21 U/L (ref 0–44)
AST: 22 U/L (ref 15–41)
Albumin: 3.9 g/dL (ref 3.5–5.0)
Alkaline Phosphatase: 52 U/L (ref 38–126)
Anion gap: 8 (ref 5–15)
BUN: 16 mg/dL (ref 6–20)
CO2: 30 mmol/L (ref 22–32)
Calcium: 9.6 mg/dL (ref 8.9–10.3)
Chloride: 99 mmol/L (ref 98–111)
Creatinine, Ser: 0.65 mg/dL (ref 0.44–1.00)
GFR, Estimated: >60 mL/min (ref >60)
Glucose, Bld: 115 mg/dL — ABNORMAL HIGH (ref 70–99)
Potassium: 4.5 mmol/L (ref 3.5–5.1)
Sodium: 137 mmol/L (ref 135–145)
Total Bilirubin: 0.3 mg/dL (ref 0.3–1.2)
Total Protein: 8.5 g/dL — ABNORMAL HIGH (ref 6.5–8.1)

## 2021-05-14 LAB — CBC
HCT: 40.8 % (ref 36.0–46.0)
Hemoglobin: 12.3 g/dL (ref 12.0–15.0)
MCH: 28.4 pg (ref 26.0–34.0)
MCHC: 30.1 g/dL (ref 30.0–36.0)
MCV: 94.2 fL (ref 80.0–100.0)
Platelets: 250 10*3/uL (ref 150–400)
RBC: 4.33 MIL/uL (ref 3.87–5.11)
RDW: 14.6 % (ref 11.5–15.5)
WBC: 9.2 10*3/uL (ref 4.0–10.5)
nRBC: 0 % (ref 0.0–0.2)

## 2021-05-14 LAB — GLUCOSE, CAPILLARY: Glucose-Capillary: 120 mg/dL — ABNORMAL HIGH (ref 70–99)

## 2021-05-15 ENCOUNTER — Telehealth: Payer: Self-pay

## 2021-05-15 LAB — HEMOGLOBIN A1C
Hgb A1c MFr Bld: 7.1 % — ABNORMAL HIGH (ref 4.8–5.6)
Mean Plasma Glucose: 157 mg/dL

## 2021-05-15 NOTE — Telephone Encounter (Signed)
Attempted to reach patient to check in with her pre-operatively. Unable to reach patient. Left message requesting return call.  

## 2021-05-15 NOTE — Telephone Encounter (Signed)
Telephone call to Ms. Loeffelholz to check on pre-operative status.  Patient compliant with pre-operative instructions.  Reinforced nothing to eat after midnight. Clear liquids until 1200. Patient to arrive at 1245. No questions or concerns voiced.  Instructed to call for any needs.

## 2021-05-16 ENCOUNTER — Other Ambulatory Visit: Payer: Self-pay

## 2021-05-16 ENCOUNTER — Observation Stay (HOSPITAL_COMMUNITY)
Admission: RE | Admit: 2021-05-16 | Discharge: 2021-05-17 | Disposition: A | Discharge status: Home / Self Care | Payer: Medicare Other | Attending: Gynecologic Oncology | Admitting: Gynecologic Oncology

## 2021-05-16 ENCOUNTER — Ambulatory Visit (HOSPITAL_COMMUNITY): Payer: Medicare Other | Admitting: Physician Assistant

## 2021-05-16 ENCOUNTER — Encounter (HOSPITAL_COMMUNITY): Payer: Self-pay | Admitting: Gynecologic Oncology

## 2021-05-16 ENCOUNTER — Ambulatory Visit (HOSPITAL_COMMUNITY): Payer: Medicare Other | Admitting: Certified Registered"

## 2021-05-16 ENCOUNTER — Ambulatory Visit (HOSPITAL_COMMUNITY): Payer: Medicare Other

## 2021-05-16 ENCOUNTER — Encounter (HOSPITAL_COMMUNITY)
Admission: RE | Disposition: A | Discharge status: Home / Self Care | Payer: Self-pay | Source: Home / Self Care | Attending: Gynecologic Oncology

## 2021-05-16 DIAGNOSIS — K429 Umbilical hernia without obstruction or gangrene: Secondary | ICD-10-CM | POA: Diagnosis not present

## 2021-05-16 DIAGNOSIS — R0902 Hypoxemia: Secondary | ICD-10-CM | POA: Diagnosis not present

## 2021-05-16 DIAGNOSIS — F419 Anxiety disorder, unspecified: Secondary | ICD-10-CM | POA: Diagnosis not present

## 2021-05-16 DIAGNOSIS — Z90721 Acquired absence of ovaries, unilateral: Secondary | ICD-10-CM

## 2021-05-16 DIAGNOSIS — R238 Other skin changes: Secondary | ICD-10-CM | POA: Diagnosis not present

## 2021-05-16 DIAGNOSIS — Z79899 Other long term (current) drug therapy: Secondary | ICD-10-CM | POA: Diagnosis not present

## 2021-05-16 DIAGNOSIS — N736 Female pelvic peritoneal adhesions (postinfective): Secondary | ICD-10-CM | POA: Diagnosis not present

## 2021-05-16 DIAGNOSIS — Z96651 Presence of right artificial knee joint: Secondary | ICD-10-CM | POA: Insufficient documentation

## 2021-05-16 DIAGNOSIS — I10 Essential (primary) hypertension: Secondary | ICD-10-CM | POA: Diagnosis not present

## 2021-05-16 DIAGNOSIS — Z90710 Acquired absence of both cervix and uterus: Secondary | ICD-10-CM

## 2021-05-16 DIAGNOSIS — E119 Type 2 diabetes mellitus without complications: Secondary | ICD-10-CM | POA: Insufficient documentation

## 2021-05-16 DIAGNOSIS — C541 Malignant neoplasm of endometrium: Secondary | ICD-10-CM | POA: Diagnosis not present

## 2021-05-16 DIAGNOSIS — I517 Cardiomegaly: Secondary | ICD-10-CM | POA: Diagnosis not present

## 2021-05-16 DIAGNOSIS — I471 Supraventricular tachycardia: Secondary | ICD-10-CM | POA: Diagnosis not present

## 2021-05-16 HISTORY — PX: LYMPH NODE DISSECTION: SHX5087

## 2021-05-16 HISTORY — PX: SENTINEL NODE BIOPSY: SHX6608

## 2021-05-16 HISTORY — PX: ROBOTIC ASSISTED TOTAL HYSTERECTOMY WITH BILATERAL SALPINGO OOPHERECTOMY: SHX6086

## 2021-05-16 LAB — TYPE AND SCREEN
ABO/RH(D): A POS
Antibody Screen: NEGATIVE

## 2021-05-16 LAB — ABO/RH: ABO/RH(D): A POS

## 2021-05-16 LAB — GLUCOSE, CAPILLARY
Glucose-Capillary: 121 mg/dL — ABNORMAL HIGH (ref 70–99)
Glucose-Capillary: 193 mg/dL — ABNORMAL HIGH (ref 70–99)

## 2021-05-16 SURGERY — HYSTERECTOMY, TOTAL, ROBOT-ASSISTED, LAPAROSCOPIC, WITH BILATERAL SALPINGO-OOPHORECTOMY
Anesthesia: General | Site: Pelvis

## 2021-05-16 MED ORDER — PROPOFOL 10 MG/ML IV BOLUS
INTRAVENOUS | Status: DC | PRN
  Administered 2021-05-16: 200 mg via INTRAVENOUS

## 2021-05-16 MED ORDER — SIMETHICONE 80 MG PO CHEW: 80.0000 mg | CHEWABLE_TABLET | Freq: Four times a day (QID) | ORAL | Status: DC | PRN

## 2021-05-16 MED ORDER — FENTANYL CITRATE (PF) 100 MCG/2ML IJ SOLN
INTRAMUSCULAR | Status: AC
  Filled 2021-05-16: qty 2

## 2021-05-16 MED ORDER — SENNA 8.6 MG PO TABS
1.0000 | ORAL_TABLET | Freq: Two times a day (BID) | ORAL | Status: DC
  Administered 2021-05-16 – 2021-05-17 (×2): 8.6 mg via ORAL
  Filled 2021-05-16 (×2): qty 1

## 2021-05-16 MED ORDER — SUGAMMADEX SODIUM 200 MG/2ML IV SOLN
INTRAVENOUS | Status: DC | PRN
  Administered 2021-05-16: 300 mg via INTRAVENOUS

## 2021-05-16 MED ORDER — METFORMIN HCL 500 MG PO TABS
1000.0000 mg | ORAL_TABLET | Freq: Two times a day (BID) | ORAL | Status: DC
  Administered 2021-05-17: 1000 mg via ORAL
  Filled 2021-05-16: qty 2

## 2021-05-16 MED ORDER — OXYCODONE HCL 5 MG PO TABS: 5.0000 mg | ORAL_TABLET | Freq: Once | ORAL | Status: DC | PRN

## 2021-05-16 MED ORDER — LEVALBUTEROL HCL 1.25 MG/0.5ML IN NEBU
1.2500 mg | INHALATION_SOLUTION | Freq: Three times a day (TID) | RESPIRATORY_TRACT | Status: DC
Start: 2021-05-16 — End: 2021-05-16

## 2021-05-16 MED ORDER — ONDANSETRON HCL 4 MG/2ML IJ SOLN: 4.0000 mg | Freq: Once | INTRAMUSCULAR | Status: DC | PRN

## 2021-05-16 MED ORDER — ROCURONIUM BROMIDE 10 MG/ML (PF) SYRINGE
PREFILLED_SYRINGE | INTRAVENOUS | Status: DC | PRN
  Administered 2021-05-16: 20 mg via INTRAVENOUS
  Administered 2021-05-16 (×2): 30 mg via INTRAVENOUS
  Administered 2021-05-16: 20 mg via INTRAVENOUS

## 2021-05-16 MED ORDER — LACTATED RINGERS IR SOLN
Status: DC | PRN
  Administered 2021-05-16: 1000 mL

## 2021-05-16 MED ORDER — STERILE WATER FOR INJECTION IJ SOLN
INTRAMUSCULAR | Status: AC
  Filled 2021-05-16: qty 10

## 2021-05-16 MED ORDER — ACETAMINOPHEN 325 MG PO TABS
650.0000 mg | ORAL_TABLET | ORAL | Status: DC | PRN
  Administered 2021-05-16 – 2021-05-17 (×2): 650 mg via ORAL
  Filled 2021-05-16 (×2): qty 2

## 2021-05-16 MED ORDER — ENSURE ENLIVE PO LIQD: 237.0000 mL | Freq: Two times a day (BID) | ORAL | Status: DC

## 2021-05-16 MED ORDER — LEVONORGESTREL 20 MCG/DAY IU IUD
1.0000 | INTRAUTERINE_SYSTEM | INTRAUTERINE | Status: DC
  Filled 2021-05-16: qty 1

## 2021-05-16 MED ORDER — MIDAZOLAM HCL 2 MG/2ML IJ SOLN
INTRAMUSCULAR | Status: AC
  Filled 2021-05-16: qty 2

## 2021-05-16 MED ORDER — BUPIVACAINE HCL 0.25 % IJ SOLN
INTRAMUSCULAR | Status: DC | PRN
  Administered 2021-05-16: 33 mL

## 2021-05-16 MED ORDER — DEXAMETHASONE SODIUM PHOSPHATE 4 MG/ML IJ SOLN: 4.0000 mg | INTRAMUSCULAR | Status: DC

## 2021-05-16 MED ORDER — LEVALBUTEROL HCL 1.25 MG/0.5ML IN NEBU
1.2500 mg | INHALATION_SOLUTION | Freq: Once | RESPIRATORY_TRACT | Status: AC
  Administered 2021-05-16: 21:00:00 1.25 mg via RESPIRATORY_TRACT

## 2021-05-16 MED ORDER — ONDANSETRON HCL 4 MG/2ML IJ SOLN
INTRAMUSCULAR | Status: DC | PRN
Start: 2021-05-16 — End: 2021-05-16
  Administered 2021-05-16: 4 mg via INTRAVENOUS

## 2021-05-16 MED ORDER — FENTANYL CITRATE (PF) 100 MCG/2ML IJ SOLN
INTRAMUSCULAR | Status: DC | PRN
  Administered 2021-05-16: 100 ug via INTRAVENOUS
  Administered 2021-05-16 (×2): 50 ug via INTRAVENOUS

## 2021-05-16 MED ORDER — SUGAMMADEX SODIUM 500 MG/5ML IV SOLN
INTRAVENOUS | Status: AC
  Filled 2021-05-16: qty 5

## 2021-05-16 MED ORDER — DEXAMETHASONE SODIUM PHOSPHATE 10 MG/ML IJ SOLN
INTRAMUSCULAR | Status: AC
  Filled 2021-05-16: qty 1

## 2021-05-16 MED ORDER — LIDOCAINE 2% (20 MG/ML) 5 ML SYRINGE
INTRAMUSCULAR | Status: DC | PRN
  Administered 2021-05-16: 60 mg via INTRAVENOUS

## 2021-05-16 MED ORDER — HYDROMORPHONE HCL 2 MG/ML IJ SOLN
INTRAMUSCULAR | Status: AC
  Filled 2021-05-16: qty 1

## 2021-05-16 MED ORDER — SCOPOLAMINE 1 MG/3DAYS TD PT72
1.0000 | MEDICATED_PATCH | TRANSDERMAL | Status: DC
  Administered 2021-05-16: 13:00:00 1.5 mg via TRANSDERMAL
  Filled 2021-05-16: qty 1

## 2021-05-16 MED ORDER — STERILE WATER FOR IRRIGATION IR SOLN
Status: DC | PRN
  Administered 2021-05-16: 1000 mL

## 2021-05-16 MED ORDER — OXYCODONE HCL 5 MG/5ML PO SOLN: 5.0000 mg | Freq: Once | ORAL | Status: DC | PRN

## 2021-05-16 MED ORDER — HYDROMORPHONE HCL 1 MG/ML IJ SOLN
INTRAMUSCULAR | Status: DC | PRN
  Administered 2021-05-16: .4 mg via INTRAVENOUS

## 2021-05-16 MED ORDER — ONDANSETRON HCL 4 MG PO TABS: 4.0000 mg | ORAL_TABLET | Freq: Four times a day (QID) | ORAL | Status: DC | PRN

## 2021-05-16 MED ORDER — HEPARIN SODIUM (PORCINE) 5000 UNIT/ML IJ SOLN
5000.0000 [IU] | INTRAMUSCULAR | Status: AC
  Administered 2021-05-16: 13:00:00 5000 [IU] via SUBCUTANEOUS
  Filled 2021-05-16: qty 1

## 2021-05-16 MED ORDER — HYDROMORPHONE HCL 1 MG/ML IJ SOLN
INTRAMUSCULAR | Status: AC
  Filled 2021-05-16: qty 1

## 2021-05-16 MED ORDER — BUPIVACAINE HCL 0.25 % IJ SOLN
INTRAMUSCULAR | Status: AC
  Filled 2021-05-16: qty 1

## 2021-05-16 MED ORDER — ONDANSETRON HCL 4 MG/2ML IJ SOLN: 4.0000 mg | Freq: Four times a day (QID) | INTRAMUSCULAR | Status: DC | PRN

## 2021-05-16 MED ORDER — DULOXETINE HCL 60 MG PO CPEP
120.0000 mg | ORAL_CAPSULE | Freq: Every day | ORAL | Status: DC
  Administered 2021-05-17: 120 mg via ORAL
  Filled 2021-05-16: qty 2

## 2021-05-16 MED ORDER — ONDANSETRON HCL 4 MG/2ML IJ SOLN
INTRAMUSCULAR | Status: AC
  Filled 2021-05-16: qty 2

## 2021-05-16 MED ORDER — ROCURONIUM BROMIDE 10 MG/ML (PF) SYRINGE
PREFILLED_SYRINGE | INTRAVENOUS | Status: AC
  Filled 2021-05-16: qty 10

## 2021-05-16 MED ORDER — CHLORHEXIDINE GLUCONATE 0.12 % MT SOLN
15.0000 mL | Freq: Once | OROMUCOSAL | Status: AC
  Administered 2021-05-16: 13:00:00 15 mL via OROMUCOSAL

## 2021-05-16 MED ORDER — LACTATED RINGERS IV SOLN: INTRAVENOUS | Status: DC

## 2021-05-16 MED ORDER — PROPOFOL 10 MG/ML IV BOLUS
INTRAVENOUS | Status: AC
  Filled 2021-05-16: qty 20

## 2021-05-16 MED ORDER — MIDAZOLAM HCL 2 MG/2ML IJ SOLN
INTRAMUSCULAR | Status: DC | PRN
  Administered 2021-05-16: 2 mg via INTRAVENOUS

## 2021-05-16 MED ORDER — LEVALBUTEROL HCL 1.25 MG/0.5ML IN NEBU
INHALATION_SOLUTION | RESPIRATORY_TRACT | Status: AC
  Filled 2021-05-16: qty 0.5

## 2021-05-16 MED ORDER — DROPERIDOL 2.5 MG/ML IJ SOLN
INTRAMUSCULAR | Status: DC | PRN
Start: 2021-05-16 — End: 2021-05-16
  Administered 2021-05-16: .625 mg via INTRAVENOUS

## 2021-05-16 MED ORDER — KETOROLAC TROMETHAMINE 30 MG/ML IJ SOLN
INTRAMUSCULAR | Status: AC
  Filled 2021-05-16: qty 1

## 2021-05-16 MED ORDER — DEXAMETHASONE SODIUM PHOSPHATE 10 MG/ML IJ SOLN
INTRAMUSCULAR | Status: DC | PRN
  Administered 2021-05-16: 8 mg via INTRAVENOUS

## 2021-05-16 MED ORDER — OXYCODONE HCL 5 MG PO TABS
5.0000 mg | ORAL_TABLET | Freq: Four times a day (QID) | ORAL | Status: DC | PRN
  Administered 2021-05-16 – 2021-05-17 (×2): 5 mg via ORAL
  Filled 2021-05-16 (×2): qty 1

## 2021-05-16 MED ORDER — CEFAZOLIN IN SODIUM CHLORIDE 3-0.9 GM/100ML-% IV SOLN
3.0000 g | INTRAVENOUS | Status: AC
  Administered 2021-05-16: 15:00:00 3 g via INTRAVENOUS
  Filled 2021-05-16: qty 100

## 2021-05-16 MED ORDER — ENOXAPARIN SODIUM 40 MG/0.4ML IJ SOSY
40.0000 mg | PREFILLED_SYRINGE | INTRAMUSCULAR | Status: DC
  Administered 2021-05-17: 40 mg via SUBCUTANEOUS
  Filled 2021-05-16: qty 0.4

## 2021-05-16 MED ORDER — SUCCINYLCHOLINE CHLORIDE 200 MG/10ML IV SOSY
PREFILLED_SYRINGE | INTRAVENOUS | Status: DC | PRN
  Administered 2021-05-16: 200 mg via INTRAVENOUS

## 2021-05-16 MED ORDER — IBUPROFEN 400 MG PO TABS: 600.0000 mg | ORAL_TABLET | Freq: Four times a day (QID) | ORAL | Status: DC | PRN

## 2021-05-16 MED ORDER — KETOROLAC TROMETHAMINE 30 MG/ML IJ SOLN
30.0000 mg | Freq: Once | INTRAMUSCULAR | Status: AC | PRN
  Administered 2021-05-16: 18:00:00 30 mg via INTRAVENOUS

## 2021-05-16 MED ORDER — HYDROMORPHONE HCL 1 MG/ML IJ SOLN
0.2500 mg | INTRAMUSCULAR | Status: DC | PRN
  Administered 2021-05-16 (×2): 0.25 mg via INTRAVENOUS
  Administered 2021-05-16: 18:00:00 0.5 mg via INTRAVENOUS

## 2021-05-16 MED ORDER — ACETAMINOPHEN 500 MG PO TABS
1000.0000 mg | ORAL_TABLET | ORAL | Status: AC
  Administered 2021-05-16: 13:00:00 1000 mg via ORAL
  Filled 2021-05-16: qty 2

## 2021-05-16 MED ORDER — ORAL CARE MOUTH RINSE: 15.0000 mL | Freq: Once | OROMUCOSAL | Status: AC

## 2021-05-16 MED ORDER — LIDOCAINE HCL (PF) 2 % IJ SOLN
INTRAMUSCULAR | Status: AC
  Filled 2021-05-16: qty 5

## 2021-05-16 SURGICAL SUPPLY — 89 items
APPLICATOR SURGIFLO ENDO (HEMOSTASIS) IMPLANT
BACTOSHIELD CHG 4% 4OZ (MISCELLANEOUS) ×1
BAG COUNTER SPONGE SURGICOUNT (BAG) ×4 IMPLANT
BAG LAPAROSCOPIC 12 15 PORT 16 (BASKET) ×1 IMPLANT
BAG RETRIEVAL 12/15 (BASKET) ×4
BIPOLAR CUTTING LOOP 21FR (ELECTRODE)
BLADE SURG SZ10 CARB STEEL (BLADE) IMPLANT
COVER BACK TABLE 60X90IN (DRAPES) ×2 IMPLANT
COVER TIP SHEARS 8 DVNC (MISCELLANEOUS) ×3 IMPLANT
COVER TIP SHEARS 8MM DA VINCI (MISCELLANEOUS) ×1
DECANTER SPIKE VIAL GLASS SM (MISCELLANEOUS) ×4 IMPLANT
DERMABOND ADVANCED (GAUZE/BANDAGES/DRESSINGS) ×1
DERMABOND ADVANCED .7 DNX12 (GAUZE/BANDAGES/DRESSINGS) ×3 IMPLANT
DEVICE MYOSURE LITE (MISCELLANEOUS) IMPLANT
DEVICE MYOSURE REACH (MISCELLANEOUS) IMPLANT
DILATOR CANAL MILEX (MISCELLANEOUS) IMPLANT
DRAPE ARM DVNC X/XI (DISPOSABLE) ×12 IMPLANT
DRAPE COLUMN DVNC XI (DISPOSABLE) ×3 IMPLANT
DRAPE DA VINCI XI ARM (DISPOSABLE) ×4
DRAPE DA VINCI XI COLUMN (DISPOSABLE) ×1
DRAPE SHEET LG 3/4 BI-LAMINATE (DRAPES) ×4 IMPLANT
DRAPE SURG IRRIG POUCH 19X23 (DRAPES) ×4 IMPLANT
DRSG OPSITE POSTOP 4X6 (GAUZE/BANDAGES/DRESSINGS) IMPLANT
DRSG OPSITE POSTOP 4X8 (GAUZE/BANDAGES/DRESSINGS) IMPLANT
DRSG TELFA 3X8 NADH (GAUZE/BANDAGES/DRESSINGS) IMPLANT
ELECT PENCIL ROCKER SW 15FT (MISCELLANEOUS) IMPLANT
ELECT REM PT RETURN 15FT ADLT (MISCELLANEOUS) ×4 IMPLANT
GAUZE 4X4 16PLY ~~LOC~~+RFID DBL (SPONGE) ×4 IMPLANT
GLOVE SURG ENC MOIS LTX SZ6 (GLOVE) ×16 IMPLANT
GLOVE SURG ENC MOIS LTX SZ6.5 (GLOVE) ×8 IMPLANT
GOWN STRL REUS W/ TWL LRG LVL3 (GOWN DISPOSABLE) ×12 IMPLANT
GOWN STRL REUS W/TWL LRG LVL3 (GOWN DISPOSABLE) ×6 IMPLANT
HOLDER FOLEY CATH W/STRAP (MISCELLANEOUS) IMPLANT
IRRIG SUCT STRYKERFLOW 2 WTIP (MISCELLANEOUS) ×4
IRRIGATION SUCT STRKRFLW 2 WTP (MISCELLANEOUS) ×3 IMPLANT
IV NS IRRIG 3000ML ARTHROMATIC (IV SOLUTION) ×2 IMPLANT
KIT PROCEDURE DA VINCI SI (MISCELLANEOUS) ×1
KIT PROCEDURE DVNC SI (MISCELLANEOUS) ×1 IMPLANT
KIT PROCEDURE FLUENT (KITS) IMPLANT
KIT TURNOVER KIT A (KITS) IMPLANT
LIGASURE VESSEL 5MM BLUNT TIP (ELECTROSURGICAL) ×2 IMPLANT
LOOP CUTTING BIPOLAR 21FR (ELECTRODE) IMPLANT
MANIPULATOR ADVINCU DEL 3.0 PL (MISCELLANEOUS) IMPLANT
MANIPULATOR ADVINCU DEL 3.5 PL (MISCELLANEOUS) ×2 IMPLANT
MANIPULATOR UTERINE 4.5 ZUMI (MISCELLANEOUS) IMPLANT
MYOSURE XL FIBROID (MISCELLANEOUS)
NDL HYPO 21X1.5 SAFETY (NEEDLE) ×2 IMPLANT
NDL SPNL 18GX3.5 QUINCKE PK (NEEDLE) IMPLANT
NEEDLE HYPO 21X1.5 SAFETY (NEEDLE) ×4 IMPLANT
NEEDLE SPNL 18GX3.5 QUINCKE PK (NEEDLE) ×4 IMPLANT
OBTURATOR OPTICAL STANDARD 8MM (TROCAR) ×1
OBTURATOR OPTICAL STND 8 DVNC (TROCAR) ×3
OBTURATOR OPTICALSTD 8 DVNC (TROCAR) ×3 IMPLANT
PACK ROBOT GYN CUSTOM WL (TRAY / TRAY PROCEDURE) ×4 IMPLANT
PACK VAGINAL MINOR WOMEN LF (CUSTOM PROCEDURE TRAY) ×4 IMPLANT
PAD DRESSING TELFA 3X8 NADH (GAUZE/BANDAGES/DRESSINGS) ×2 IMPLANT
PAD OB MATERNITY 4.3X12.25 (PERSONAL CARE ITEMS) ×2 IMPLANT
PAD POSITIONING PINK XL (MISCELLANEOUS) ×4 IMPLANT
PAD PREP 24X48 CUFFED NSTRL (MISCELLANEOUS) ×2 IMPLANT
PORT ACCESS TROCAR AIRSEAL 12 (TROCAR) ×3 IMPLANT
PORT ACCESS TROCAR AIRSEAL 5M (TROCAR) ×1
POUCH SPECIMEN RETRIEVAL 10MM (ENDOMECHANICALS) IMPLANT
SCRUB CHG 4% DYNA-HEX 4OZ (MISCELLANEOUS) ×3 IMPLANT
SEAL CANN UNIV 5-8 DVNC XI (MISCELLANEOUS) ×12 IMPLANT
SEAL ROD LENS SCOPE MYOSURE (ABLATOR) IMPLANT
SEAL XI 5MM-8MM UNIVERSAL (MISCELLANEOUS) ×4
SET TRI-LUMEN FLTR TB AIRSEAL (TUBING) ×4 IMPLANT
SPONGE T-LAP 18X18 ~~LOC~~+RFID (SPONGE) IMPLANT
SURGIFLO W/THROMBIN 8M KIT (HEMOSTASIS) IMPLANT
SUT MNCRL AB 4-0 PS2 18 (SUTURE) IMPLANT
SUT PDS AB 1 TP1 96 (SUTURE) IMPLANT
SUT VIC AB 0 CT1 27 (SUTURE)
SUT VIC AB 0 CT1 27XBRD ANTBC (SUTURE) IMPLANT
SUT VIC AB 2-0 CT1 27 (SUTURE)
SUT VIC AB 2-0 CT1 TAPERPNT 27 (SUTURE) IMPLANT
SUT VIC AB 2-0 SH 27 (SUTURE) ×1
SUT VIC AB 2-0 SH 27X BRD (SUTURE) ×1 IMPLANT
SUT VIC AB 4-0 PS2 18 (SUTURE) ×8 IMPLANT
SYR 10ML LL (SYRINGE) ×2 IMPLANT
SYSTEM TISS REMOVAL MYOSURE XL (MISCELLANEOUS) IMPLANT
TOWEL OR 17X26 10 PK STRL BLUE (TOWEL DISPOSABLE) ×2 IMPLANT
TOWEL OR NON WOVEN STRL DISP B (DISPOSABLE) IMPLANT
TRAP SPECIMEN MUCUS 40CC (MISCELLANEOUS) IMPLANT
TRAY FOLEY MTR SLVR 16FR STAT (SET/KITS/TRAYS/PACK) ×4 IMPLANT
TROCAR XCEL NON-BLD 5MMX100MML (ENDOMECHANICALS) IMPLANT
UNDERPAD 30X36 HEAVY ABSORB (UNDERPADS AND DIAPERS) ×8 IMPLANT
WATER STERILE IRR 1000ML POUR (IV SOLUTION) ×4 IMPLANT
WATER STERILE IRR 500ML POUR (IV SOLUTION) ×4 IMPLANT
YANKAUER SUCT BULB TIP 10FT TU (MISCELLANEOUS) ×2 IMPLANT

## 2021-05-16 NOTE — Transfer of Care (Signed)
Immediate Anesthesia Transfer of Care Note  Patient: Leia Coletti Daleo  Procedure(s) Performed: XI ROBOTIC ASSISTED TOTAL HYSTERECTOMY WITH BILATERAL SALPINGO OOPHORECTOMY (Bilateral: Pelvis) SENTINEL NODE BIOPSY (Pelvis) LYMPH NODE DISSECTION (Pelvis)  Patient Location: PACU  Anesthesia Type:General  Level of Consciousness: drowsy  Airway & Oxygen Therapy: Patient Spontanous Breathing and Patient connected to face mask oxygen  Post-op Assessment: Report given to RN, Post -op Vital signs reviewed and stable and Patient moving all extremities X 4  Post vital signs: Reviewed and stable  Last Vitals:  Vitals Value Taken Time  BP 125/81 05/16/21 1715  Temp    Pulse 83 05/16/21 1716  Resp 15 05/16/21 1716  SpO2 92 % 05/16/21 1716  Vitals shown include unvalidated device data.  Last Pain:  Vitals:   05/16/21 1313  TempSrc:   PainSc: 7       Patients Stated Pain Goal: 4 (64/15/83 0940)  Complications: No notable events documented.

## 2021-05-16 NOTE — Discharge Instructions (Addendum)
AFTER SURGERY INSTRUCTIONS   Return to work: 4-6 weeks if applicable  You will need to follow up with your PCP about your sleep apnea symptoms.   Activity: 1. Be up and out of the bed during the day.  Take a nap if needed.  You may walk up steps but be careful and use the hand rail.  Stair climbing will tire you more than you think, you may need to stop part way and rest.    2. No lifting or straining for 6 weeks over 10 pounds. No pushing, pulling, straining for 6 weeks.   3. No driving for around 1 week(s).  Do not drive if you are taking narcotic pain medicine and make sure that your reaction time has returned.    4. You can shower as soon as the next day after surgery. Shower daily.  Use your regular soap and water (not directly on the incision) and pat your incision(s) dry afterwards; don't rub.  No tub baths or submerging your body in water until cleared by your surgeon. If you have the soap that was given to you by pre-surgical testing that was used before surgery, you do not need to use it afterwards because this can irritate your incisions.    5. No sexual activity and nothing in the vagina for 8 weeks.   6. You may experience a small amount of clear drainage from your incisions, which is normal.  If the drainage persists, increases, or changes color please call the office.   7. Do not use creams, lotions, or ointments such as neosporin on your incisions after surgery until advised by your surgeon because they can cause removal of the dermabond glue on your incisions.     8. You may experience vaginal spotting after surgery or around the 6-8 week mark from surgery when the stitches at the top of the vagina begin to dissolve.  The spotting is normal but if you experience heavy bleeding, call our office.   9. Take Tylenol first for pain and only use narcotic pain medication for severe pain not relieved by the Tylenol.  Monitor your Tylenol intake to a max of 4,000 mg in a 24 hour  period.   Diet: 1. Low sodium Heart Healthy Diet is recommended but you are cleared to resume your normal (before surgery) diet after your procedure.   2. It is safe to use a laxative, such as Miralax or Colace, if you have difficulty moving your bowels. You have been prescribed Sennakot-S to take at bedtime every evening after surgery to keep bowel movements regular and to prevent constipation.     Wound Care: 1. Keep clean and dry.  Shower daily.   Reasons to call the Doctor: Fever - Oral temperature greater than 100.4 degrees Fahrenheit Foul-smelling vaginal discharge Difficulty urinating Nausea and vomiting Increased pain at the site of the incision that is unrelieved with pain medicine. Difficulty breathing with or without chest pain New calf pain especially if only on one side Sudden, continuing increased vaginal bleeding with or without clots.   Contacts: For questions or concerns you should contact:   Dr. Jeral Pinch at [phone]   Joylene John, NP at [phone]   After Hours: call [phone] and have the GYN Oncologist paged/contacted (after 5 pm or on the weekends).   Messages sent via mychart are for non-urgent matters and are not responded to after hours so for urgent needs, please call the after hours number.

## 2021-05-16 NOTE — Op Note (Signed)
OPERATIVE NOTE  Pre-operative Diagnosis: endometrial cancer grade 1  Post-operative Diagnosis: same, umbilical hernia  Operation: Robotic-assisted laparoscopic total hysterectomy with bilateral salpingoophorectomy, SLN biopsy, hernia reduction, bladder serosal repair, vaginal laceration repair   Surgeon: Jeral Pinch MD  Assistant Surgeon: Lahoma Crocker MD (an MD assistant was necessary for tissue manipulation, management of robotic instrumentation, retraction and positioning due to the complexity of the case and hospital policies).   Anesthesia: GET  Urine Output: 250cc  Operative Findings: On EUA, 10 cm mobile uterus. Normal upper abdomen including liver edge and diaphragm. Lap band in place. 2-3 cm umbilical hernia with significant amount of omentum within hernia. Additional omentum adherent to the anterior abdominal wall inferior to the umbilicus. Normal small and large bowel. Uterus 10cm, normal appearing. Normal appearing right adnexa. Left ovary somewhat adherent to the sigmoid colon mesentery. Mapping successful to right obturator and left external iliac SLN. Mild adhesions between bladder and cervix. 1cm area of serosal disruption noted to the bladder after creation of bladder flap, reinforced. No intra-abdominal or pelvic evidence of disease. Left side wall of the vagina and distal left vaginal lacerations repaired.   Estimated Blood Loss:  150cc      Total IV Fluids: see I&O flowsheet         Specimens: uterus, cervix, bilateral tubes and ovaries, right obturator SLN, left external iliac SLN         Complications:  None apaprent; patient tolerated the procedure well.         Disposition: PACU - hemodynamically stable.  Procedure Details  The patient was seen in the Holding Room. The risks, benefits, complications, treatment options, and expected outcomes were discussed with the patient.  The patient concurred with the proposed plan, giving informed consent.  The  site of surgery properly noted/marked. The patient was identified as Claire Arnold and the procedure verified as a Robotic-assisted hysterectomy with bilateral salpingo oophorectomy with SLN biopsy.   After induction of anesthesia, the patient was draped and prepped in the usual sterile manner. Patient was placed in supine position after anesthesia and draped and prepped in the usual sterile manner as follows: Her arms were tucked to her side with all appropriate precautions.  The shoulders were stabilized with padded shoulder blocks applied to the acromium processes.  The patient was placed in the semi-lithotomy position in [place].  The perineum and vagina were prepped with CholoraPrep. The patient was draped after the CholoraPrep had been allowed to dry for 3 minutes.  A Time Out was held and the above information confirmed.  The urethra was prepped with Betadine. Foley catheter was placed.  A sterile speculum was placed in the vagina.  The cervix was grasped with a single-tooth tenaculum. 2mg  total of ICG was injected into the cervical stroma at 2 and 9 o'clock with 1cc injected at a 1cm and 43mm depth (concentration 0.5mg /ml) in all locations. The cervix was dilated with Kennon Rounds dilators.  The 3.5 Delineator uterine manipulator with a colpotomizer ring was placed without difficulty.  A pneum occluder balloon was placed over the manipulator.  OG tube placement was confirmed and to suction.   Next, a 10 mm skin incision was made 1 cm below the subcostal margin in the midclavicular line.  The 5 mm Optiview port and scope was used for direct entry.  Opening pressure was under 10 mm CO2.  The abdomen was insufflated and the findings were noted as above.   At this point and all points  during the procedure, the patient's intra-abdominal pressure did not exceed 15 mmHg. Next, an 8 mm skin incision was made superior to the umbilicus and a right and left port were placed about 8 cm lateral to the robot port  on the right and left side.  A fourth arm was placed on the right.  The 5 mm assist trocar was exchanged for a 10-12 mm port. All ports were placed under direct visualization.  The patient was placed in steep Trendelenburg.    Combination of blunt dissection and bipolar electrocautery were used to reduce the omentum from inside the hernia sac.   Bowel was folded away into the upper abdomen.  The robot was docked in the normal manner. Attention was then turned to lysis of adhesions of the omentum to the anterior abdominal and of the sigmoid mesentery to the left pelvic sidewall and ovary.   The right and left peritoneum were opened parallel to the IP ligament to open the retroperitoneal spaces bilaterally. The round ligaments were transected. The SLN mapping was performed in bilateral pelvic basins. After identifying the ureters, the para rectal and paravesical spaces were opened up entirely with careful dissection below the level of the ureters bilaterally and to the depth of the uterine artery origin in order to skeletonize the uterine "web" and ensure visualization of all parametrial channels. The para-aortic basins were carefully exposed and evaluated for isolated para-aortic SLN's. Lymphatic channels were identified travelling to the following visualized sentinel lymph node's: right obturator SLN and left external iliac SLN. These SLN's were separated from their surrounding lymphatic tissue, removed and sent for permanent pathology.  The hysterectomy was started.  The ureter was again noted to be on the medial leaf of the broad ligament.  The peritoneum above the ureter was incised and stretched and the infundibulopelvic ligament was skeletonized, cauterized and cut.  The posterior peritoneum was taken down to the level of the KOH ring.  The anterior peritoneum was also taken down.  The bladder flap was created to the level of the KOH ring.  The uterine artery on the right side was skeletonized,  cauterized and cut in the normal manner.  A similar procedure was performed on the left.  The colpotomy was made and the uterus, cervix, bilateral ovaries and tubes were amputated and delivered through the vagina.  Pedicles were inspected and excellent hemostasis was achieved.    Bladder serosa with very small defect, oversewed with figure of eight 2-0 Vicryl.   The colpotomy at the vaginal cuff was closed with Vicryl on a CT1 needle in a running manner.  Irrigation was used and excellent hemostasis was achieved.  At this point in the procedure was completed.  Robotic instruments were removed under direct visulaization.  The robot was undocked. The fascia at the 10-12 mm port was closed with 0 Vicryl on a UR-5 needle.  The subcuticular tissue was closed with 4-0 Vicryl and the skin was closed with 4-0 Monocryl in a subcuticular manner.  Dermabond was applied.    Vaginal lacerations, including left sidewall and left distal vagina, repaired with 2-0 Vicryl.  The vagina was swabbed with  minimal bleeding noted. Foley catheter was removed.  All sponge, lap and needle counts were correct x  3.   The patient was transferred to the recovery room in stable condition.  Jeral Pinch, MD

## 2021-05-16 NOTE — Anesthesia Postprocedure Evaluation (Signed)
Anesthesia Post Note  Patient: Claire Arnold  Procedure(s) Performed: XI ROBOTIC ASSISTED TOTAL HYSTERECTOMY WITH BILATERAL SALPINGO OOPHORECTOMY (Bilateral: Pelvis) SENTINEL NODE BIOPSY (Pelvis) LYMPH NODE DISSECTION (Pelvis)     Patient location during evaluation: PACU Anesthesia Type: General Level of consciousness: awake and alert Pain management: pain level controlled Vital Signs Assessment: post-procedure vital signs reviewed and stable Respiratory status: spontaneous breathing, nonlabored ventilation, respiratory function stable and patient connected to nasal cannula oxygen Cardiovascular status: blood pressure returned to baseline and stable Postop Assessment: no apparent nausea or vomiting Anesthetic complications: no   No notable events documented.  Last Vitals:  Vitals:   05/16/21 1745 05/16/21 1800  BP: 128/83 128/85  Pulse: 90 85  Resp: 15 13  Temp:    SpO2: 90% (!) 89%    Last Pain:  Vitals:   05/16/21 1800  TempSrc:   PainSc: Asleep                 Kennadi Albany S

## 2021-05-16 NOTE — Interval H&P Note (Signed)
History and Physical Interval Note:  05/16/2021 1:31 PM  Claire Arnold  has presented today for surgery, with the diagnosis of ENDOMETRIAL CANCER.  The various methods of treatment have been discussed with the patient and family. After consideration of risks, benefits and other options for treatment, the patient has consented to  Procedure(s): XI ROBOTIC ASSISTED TOTAL HYSTERECTOMY WITH BILATERAL SALPINGO OOPHORECTOMY.POSSIBLE LAPAROTOMY (Bilateral) SENTINEL NODE BIOPSY (N/A) POSSIBLE LYMPH NODE DISSECTION (N/A) POSSIBLE DILATATION AND CURETTAGE /HYSTEROSCOPY (N/A) POSSIBLE INTRAUTERINE DEVICE (IUD) INSERTION (N/A) as a surgical intervention.  The patient's history has been reviewed, patient examined, no change in status, stable for surgery.  I have reviewed the patient's chart and labs.  Questions were answered to the patient's satisfaction.     Lafonda Mosses

## 2021-05-16 NOTE — Anesthesia Preprocedure Evaluation (Signed)
Anesthesia Evaluation  Patient identified by MRN, date of birth, ID band Patient awake    Reviewed: Allergy & Precautions, NPO status , Patient's Chart, lab work & pertinent test results  Airway Mallampati: II  TM Distance: <3 FB Neck ROM: Full    Dental no notable dental hx.    Pulmonary neg pulmonary ROS,    Pulmonary exam normal breath sounds clear to auscultation       Cardiovascular hypertension, Pt. on medications Normal cardiovascular exam Rhythm:Regular Rate:Normal     Neuro/Psych Anxiety negative neurological ROS     GI/Hepatic Neg liver ROS, hiatal hernia,   Endo/Other  diabetes, Type 2Morbid obesity  Renal/GU negative Renal ROS  negative genitourinary   Musculoskeletal negative musculoskeletal ROS (+)   Abdominal   Peds negative pediatric ROS (+)  Hematology negative hematology ROS (+)   Anesthesia Other Findings   Reproductive/Obstetrics negative OB ROS                             Anesthesia Physical Anesthesia Plan  ASA: 3  Anesthesia Plan: General   Post-op Pain Management: Dilaudid IV   Induction: Intravenous  PONV Risk Score and Plan: 3 and Ondansetron, Dexamethasone, Droperidol and Treatment may vary due to age or medical condition  Airway Management Planned: Oral ETT  Additional Equipment:   Intra-op Plan:   Post-operative Plan: Extubation in OR  Informed Consent: I have reviewed the patients History and Physical, chart, labs and discussed the procedure including the risks, benefits and alternatives for the proposed anesthesia with the patient or authorized representative who has indicated his/her understanding and acceptance.     Dental advisory given  Plan Discussed with: CRNA and Surgeon  Anesthesia Plan Comments:         Anesthesia Quick Evaluation

## 2021-05-16 NOTE — Brief Op Note (Signed)
05/16/2021  5:13 PM  PATIENT:  Claire Arnold  61 y.o. female  PRE-OPERATIVE DIAGNOSIS:  ENDOMETRIAL CANCER  POST-OPERATIVE DIAGNOSIS:  ENDOMETRIAL CANCER  PROCEDURE:  Procedure(s): XI ROBOTIC ASSISTED TOTAL HYSTERECTOMY WITH BILATERAL SALPINGO OOPHORECTOMY (Bilateral) SENTINEL NODE BIOPSY (N/A) LYMPH NODE DISSECTION (N/A)  SURGEON:  Surgeon(s) and Role:    * Lafonda Mosses, MD - Primary    * Lahoma Crocker, MD - Assisting  ANESTHESIA:   general  EBL:  150 mL   BLOOD ADMINISTERED:none  DRAINS: none   LOCAL MEDICATIONS USED:  MARCAINE     SPECIMEN:  uterus, cervix, bilateral adnexa, bilateral SLNs  DISPOSITION OF SPECIMEN:  PATHOLOGY  COUNTS:  YES  TOURNIQUET:  * No tourniquets in log *  DICTATION: .Note written in EPIC  PLAN OF CARE: Discharge to home after PACU  PATIENT DISPOSITION:  PACU - hemodynamically stable.   Delay start of Pharmacological VTE agent (>24hrs) due to surgical blood loss or risk of bleeding: not applicable

## 2021-05-16 NOTE — Anesthesia Procedure Notes (Signed)
Procedure Name: Intubation Date/Time: 05/16/2021 2:18 PM Performed by: Niel Hummer, CRNA Pre-anesthesia Checklist: Patient identified, Emergency Drugs available, Suction available and Patient being monitored Patient Re-evaluated:Patient Re-evaluated prior to induction Oxygen Delivery Method: Circle system utilized Preoxygenation: Pre-oxygenation with 100% oxygen Induction Type: IV induction Ventilation: Mask ventilation without difficulty Laryngoscope Size: Mac and 4 Grade View: Grade I Tube type: Oral Tube size: 7.0 mm Number of attempts: 2 Airway Equipment and Method: Stylet Placement Confirmation: ETT inserted through vocal cords under direct vision, breath sounds checked- equal and bilateral and positive ETCO2 Secured at: 22 cm Tube secured with: Tape Dental Injury: Teeth and Oropharynx as per pre-operative assessment  Comments: DL MAC 4 by CRNA, grade 3 view. Unable to pass tube successfully. Mask ventilation with gas. DL again by CRNA with bougie available, grade 1 view, no bougie needed. Passed tube easily.

## 2021-05-17 ENCOUNTER — Encounter (HOSPITAL_COMMUNITY): Payer: Self-pay | Admitting: Gynecologic Oncology

## 2021-05-17 DIAGNOSIS — K429 Umbilical hernia without obstruction or gangrene: Secondary | ICD-10-CM | POA: Diagnosis not present

## 2021-05-17 DIAGNOSIS — C541 Malignant neoplasm of endometrium: Secondary | ICD-10-CM | POA: Diagnosis not present

## 2021-05-17 DIAGNOSIS — E119 Type 2 diabetes mellitus without complications: Secondary | ICD-10-CM | POA: Diagnosis not present

## 2021-05-17 DIAGNOSIS — I471 Supraventricular tachycardia: Secondary | ICD-10-CM | POA: Diagnosis not present

## 2021-05-17 DIAGNOSIS — R0902 Hypoxemia: Secondary | ICD-10-CM | POA: Diagnosis not present

## 2021-05-17 DIAGNOSIS — N736 Female pelvic peritoneal adhesions (postinfective): Secondary | ICD-10-CM | POA: Diagnosis not present

## 2021-05-17 DIAGNOSIS — I10 Essential (primary) hypertension: Secondary | ICD-10-CM | POA: Diagnosis not present

## 2021-05-17 LAB — GLUCOSE, CAPILLARY: Glucose-Capillary: 137 mg/dL — ABNORMAL HIGH (ref 70–99)

## 2021-05-17 NOTE — Discharge Summary (Signed)
Physician Discharge Summary  Patient ID: Claire Arnold MRN: [id number] DOB/AGE: [birth year] 61 y.o.  Admit date: 05/16/2021 Discharge date: 05/17/2021  Admission Diagnoses: Hypoxia  Discharge Diagnoses:  Principal Problem:   Hypoxia   Discharged Condition:  The patient is in good condition and stable for discharge.  Hospital Course: On 05/16/2021, the patient underwent the following: Procedure(s): XI ROBOTIC ASSISTED TOTAL HYSTERECTOMY WITH BILATERAL SALPINGO OOPHORECTOMY SENTINEL NODE BIOPSY LYMPH NODE DISSECTION. Postoperatively, she was unable to be weaned from oxygen with sats dropping in the 80s when she was moving around in PACU and also had an episode of Vtach (120-140s). Given this, she was observed overnight. She was discharged to home on postoperative day 1 tolerating a regular diet, passing flatus, voiding, ambulating, pain minimal, O2 sat 94-97 on RA. She is to follow up with her PCP about her sleep apnea symptoms and general health.  Consults: None  Significant Diagnostic Studies: EKG in PACU, O2 monitoring with pulse ox overnight.  Treatments: Surgery: see above. Oxygen supplementation (able to be discontinued on POD1)  Discharge Exam: Blood pressure 135/89, pulse 88, temperature 98.2 F (36.8 C), temperature source Oral, resp. rate 20, height 5' 2.5" (1.588 m), weight (!) 310 lb (140.6 kg), last menstrual period 05/14/2021, SpO2 99 %. General appearance: alert, cooperative, and no distress Resp: clear to auscultation bilaterally Cardio: regular rate and rhythm, S1, S2 normal, no murmur, click, rub or gallop GI: soft, non-tender; bowel sounds normal; no masses,  no organomegaly Extremities: extremities normal, atraumatic, no cyanosis or edema Incision/Wound: Lap sites to the abdomen with dermabond intact without erythema or drainage. Right lower incision re-dermabonded earlier today remaining intact and closed.  Disposition: Discharge disposition: 01-Home or  Self Care       Discharge Instructions     Call MD for:  difficulty breathing, headache or visual disturbances   Complete by: As directed    Call MD for:  difficulty breathing, headache or visual disturbances   Complete by: As directed    Call MD for:  extreme fatigue   Complete by: As directed    Call MD for:  extreme fatigue   Complete by: As directed    Call MD for:  hives   Complete by: As directed    Call MD for:  persistant dizziness or light-headedness   Complete by: As directed    Call MD for:  persistant dizziness or light-headedness   Complete by: As directed    Call MD for:  persistant nausea and vomiting   Complete by: As directed    Call MD for:  persistant nausea and vomiting   Complete by: As directed    Call MD for:  redness, tenderness, or signs of infection (pain, swelling, redness, odor or green/yellow discharge around incision site)   Complete by: As directed    Call MD for:  redness, tenderness, or signs of infection (pain, swelling, redness, odor or green/yellow discharge around incision site)   Complete by: As directed    Call MD for:  severe uncontrolled pain   Complete by: As directed    Call MD for:  severe uncontrolled pain   Complete by: As directed    Call MD for:  temperature >100.4   Complete by: As directed    Call MD for:  temperature >100.4   Complete by: As directed    Diet - low sodium heart healthy   Complete by: As directed    Driving Restrictions   Complete by: As directed  No driving for around 1 week.  Do not take narcotics and drive and you need to make sure your reaction time has returned.   Increase activity slowly   Complete by: As directed    Lifting restrictions   Complete by: As directed    No lifting greater than 10 lbs, pushing, pulling, straining for 6 weeks.   Sexual Activity Restrictions   Complete by: As directed    No sexual activity, nothing in the vagina, for 8 weeks.      Allergies as of 05/17/2021   No  Known Allergies      Medication List     TAKE these medications    acetaminophen 500 MG tablet Commonly known as: TYLENOL Take 1,000 mg by mouth every 6 (six) hours as needed for moderate pain.   Centrum Silver 50+Women Tabs Take 1 tablet by mouth daily.   cyclobenzaprine 10 MG tablet Commonly known as: FLEXERIL Take 10 mg by mouth 3 (three) times daily as needed for muscle spasms.   DULoxetine 60 MG capsule Commonly known as: CYMBALTA Take 120 mg by mouth daily.   losartan 100 MG tablet Commonly known as: COZAAR Take 100 mg by mouth daily.   metFORMIN 1000 MG tablet Commonly known as: GLUCOPHAGE Take 1,000 mg by mouth 2 (two) times daily with a meal.   Oxycodone HCl 10 MG Tabs Take 10 mg by mouth 3 (three) times daily as needed (pain).   Ozempic (0.25 or 0.5 MG/DOSE) 2 MG/1.5ML Sopn Generic drug: Semaglutide(0.25 or 0.5MG /DOS) Inject 0.25 mg into the skin every Wednesday.   senna-docusate 8.6-50 MG tablet Commonly known as: Senokot-S Take 2 tablets by mouth at bedtime. For AFTER surgery, do not take if having diarrhea        Follow-up Information     Lafonda Mosses, MD Follow up on 05/21/2021.   Specialty: Gynecologic Oncology Why: at 4:20pm will be a PHONE visit to discuss pathology. If your pathology has not returned by this date, this appt may be adjusted. IN PERSON visit will be on 06/08/21 at 3:30pm at the [facility]. Contact information: [address] [place] [address] [phone]                 Greater than thirty minutes were spend for face to face discharge instructions and discharge orders/summary in EPIC.   Signed: Dorothyann Gibbs 05/17/2021, 2:54 PM

## 2021-05-17 NOTE — Progress Notes (Signed)
GYN Onc Progress Note  Patient alert, oriented, sitting on the side of the bed. O2 sat on RA at 92-93. States she is voiding without difficulty and passing flatus. With consent, pt assisted back in bed to apply dermabond to the open incision on the right abdomen. Incision re-approximated and dermabond applied. Patient tolerated well. O2 86-88 when laying flat on RA. Once assisted back to sitting position, O2 increased to 90-92. No needs or concerns voiced at end of visit.

## 2021-05-17 NOTE — Progress Notes (Signed)
1 Day Post-Op Procedure(s) (LRB): XI ROBOTIC ASSISTED TOTAL HYSTERECTOMY WITH BILATERAL SALPINGO OOPHORECTOMY (Bilateral) SENTINEL NODE BIOPSY (N/A) LYMPH NODE DISSECTION (N/A)  Subjective: Patient reports doing well overnight.  Abdominal pain is controlled with current medications.  Has ambulated to the bathroom, voiding without difficulty.  Endorses flatus.  Tolerated some Jell-O last night.  Denies any shortness of breath, feels breathing is at baseline.  Denies any palpitations.  Objective: Vital signs in last 24 hours: Temp:  [97.6 F (36.4 C)-98.6 F (37 C)] 97.9 F (36.6 C) (01/26 0539) Pulse Rate:  [82-137] 84 (01/26 0539) Resp:  [11-26] 17 (01/26 0539) BP: (105-164)/(66-95) 105/66 (01/26 0539) SpO2:  [81 %-99 %] 95 % (01/26 0539) Weight:  [310 lb (140.6 kg)] 310 lb (140.6 kg) (01/25 1313) Last BM Date: 05/16/21  Intake/Output from previous day: 01/25 0701 - 01/26 0700 In: 1790 [P.O.:390; I.V.:1300; IV Piggyback:100] Out: 1850 [Urine:1700; Blood:150]  Physical Examination: Gen: No acute distress, lying comfortably in bed Pulmonary: Lungs clear to auscultation bilaterally, no wheezes or rhonchi.  On 2 L of oxygen Cardiovascular: Regular rate and rhythm, no murmurs or rubs Abdomen: Incisions are clean dry and intact although right lateral incision is slightly open and appears to have bled a little bit overnight.  Dermabond is intact.  No erythema or induration.  Bowel sounds present.  Abdomen appropriately tender to palpation, no guarding or rebound.  Labs: Cbg pending for this morning.      Assessment:  61 y.o. s/p Procedure(s): XI ROBOTIC ASSISTED TOTAL HYSTERECTOMY WITH BILATERAL SALPINGO OOPHORECTOMY SENTINEL NODE BIOPSY LYMPH NODE DISSECTION: doing well.  Patient is meeting postoperative milestones.  We will work on aggressively weaning oxygen.  Encourage continued IS use and being up and out of bed.  I imagine that she has undiagnosed sleep apnea and that in  commendation with her obesity has probably led to some underlying pulmonary dysfunction.  She has testing for sleep apnea set up in the next couple of months.  Plan: Discharged home later today, pending being able to wean her oxygen.   LOS: 0 days    Claire Arnold 05/17/2021, 8:39 AM

## 2021-05-17 NOTE — Progress Notes (Signed)
Transition of Care [facility]) Screening Note  Patient Details  Name: Claire Arnold Date of Birth: Oct 31, 1960  Transition of Care [facility]) CM/SW Contact:    Sherie Don, LCSW Phone Number: 05/17/2021, 9:52 AM  Transition of Care Department [facility]) has reviewed patient and no TOC needs have been identified at this time. We will continue to monitor patient advancement through interdisciplinary progression rounds. If new patient transition needs arise, please place a TOC consult.

## 2021-05-17 NOTE — Plan of Care (Signed)
  Problem: Clinical Measurements: Goal: Ability to maintain clinical measurements within normal limits will improve Outcome: Progressing   Problem: Clinical Measurements: Goal: Respiratory complications will improve Outcome: Progressing   Problem: Activity: Goal: Risk for activity intolerance will decrease Outcome: Progressing   

## 2021-05-18 ENCOUNTER — Telehealth: Payer: Self-pay

## 2021-05-18 NOTE — Telephone Encounter (Signed)
Spoke with Ms. Yankee this afternoon. She states she is eating, drinking and urinating well. She had a BM last night and passing gas. She is taking senokot as prescribed and encouraged her to drink plenty of water. She denies fever or chills. Incisions are dry and intact. She rates her pain /10. Her pain is controlled with oxycodone and tylenol.    Instructed to call office with any fever, chills, purulent drainage, uncontrolled pain or any other questions or concerns. Patient verbalizes understanding.   Pt aware of post op appointments as well as the office number [phone] and after hours number [phone] to call if she has any questions or concerns

## 2021-05-21 ENCOUNTER — Telehealth: Payer: Self-pay

## 2021-05-21 ENCOUNTER — Inpatient Hospital Stay: Payer: Medicare Other | Admitting: Gynecologic Oncology

## 2021-05-21 NOTE — Telephone Encounter (Signed)
Spoke with Claire Arnold this afternoon regarding her telephone appointment with Dr. Berline Lopes today. Her pathology from surgery is not back yet. Phone visit has been rescheduled for tomorrow afternoon. Patient verbalized understanding.

## 2021-05-22 ENCOUNTER — Inpatient Hospital Stay (HOSPITAL_BASED_OUTPATIENT_CLINIC_OR_DEPARTMENT_OTHER): Payer: Medicare Other | Admitting: Gynecologic Oncology

## 2021-05-22 DIAGNOSIS — C541 Malignant neoplasm of endometrium: Secondary | ICD-10-CM

## 2021-05-22 DIAGNOSIS — Z90710 Acquired absence of both cervix and uterus: Secondary | ICD-10-CM

## 2021-05-22 DIAGNOSIS — Z7189 Other specified counseling: Secondary | ICD-10-CM

## 2021-05-22 DIAGNOSIS — Z90722 Acquired absence of ovaries, bilateral: Secondary | ICD-10-CM

## 2021-05-22 LAB — SURGICAL PATHOLOGY

## 2021-05-22 NOTE — Progress Notes (Signed)
Gynecologic Oncology Telehealth Consult Note: Gyn-Onc  I connected with [name] on 05/23/21 at  4:40 PM EST by telephone and verified that I am speaking with the correct person using two identifiers.  I discussed the limitations, risks, security and privacy concerns of performing an evaluation and management service by telemedicine and the availability of in-person appointments. I also discussed with the patient that there may be a patient responsible charge related to this service. The patient expressed understanding and agreed to proceed.  Other persons participating in the visit and their role in the encounter: none.  Patient's location: home Provider's location: [facility]  Reason for Visit: follow-up after surgery, treatment discussion  Treatment History: Oncology History Overview Note  MMR IHC normal   Endometrial cancer, grade I ([place])  04/23/2021 Initial Biopsy   EMB: showing endometrioid carcinoma partially in follow-up and endometrial polyp, FIGO grade 1.   05/07/2021 Initial Diagnosis   Endometrial cancer, grade I ([place])   05/16/2021 Surgery   Robotic-assisted laparoscopic total hysterectomy with bilateral salpingoophorectomy, SLN biopsy, hernia reduction, bladder serosal repair, vaginal laceration repair    Findings: On EUA, 10 cm mobile uterus. Normal upper abdomen including liver edge and diaphragm. Lap band in place. 2-3 cm umbilical hernia with significant amount of omentum within hernia. Additional omentum adherent to the anterior abdominal wall inferior to the umbilicus. Normal small and large bowel. Uterus 10cm, normal appearing. Normal appearing right adnexa. Left ovary somewhat adherent to the sigmoid colon mesentery. Mapping successful to right obturator and left external iliac SLN. Mild adhesions between bladder and cervix. 1cm area of serosal disruption noted to the bladder after creation of bladder flap, reinforced. No intra-abdominal or pelvic evidence of  disease. Left side wall of the vagina and distal left vaginal lacerations repaired.    05/16/2021 Pathologic Stage   Stage IA, grade 1 MI 15% No LVI Neg SLNs     Interval History: Doing well, somewhat sore but denies pain. Incisions healing well. Normal bowel and bladder function reported. She endorses a good appetite without nausea. Denies vaginal bleeding or discharge.   Past Medical/Surgical History: Past Medical History:  Diagnosis Date   Anxiety    Arthritis    OA   Cancer ([place])    endometrial   Depression    Heart murmur    Worked up in [place] was fine   History of hiatal hernia    Hyperlipidemia    Hypertension    Obesity, morbid, BMI 50 or higher ([place])    Pneumonia    walking PNA   Type 2 diabetes mellitus ([place])     Past Surgical History:  Procedure Laterality Date   CESAREAN SECTION  11/13/1990   LAPAROSCOPIC GASTRIC BANDING     LIGAMENT REPAIR Right    right wrist   LYMPH NODE DISSECTION N/A 05/16/2021   Procedure: LYMPH NODE DISSECTION;  Surgeon: Lafonda Mosses, MD;  Location: [facility];  Service: Gynecology;  Laterality: N/A;   REPLACEMENT TOTAL KNEE Left 2000   ROBOTIC ASSISTED TOTAL HYSTERECTOMY WITH BILATERAL SALPINGO OOPHERECTOMY Bilateral 05/16/2021   Procedure: XI ROBOTIC ASSISTED TOTAL HYSTERECTOMY WITH BILATERAL SALPINGO OOPHORECTOMY;  Surgeon: Lafonda Mosses, MD;  Location: [facility];  Service: Gynecology;  Laterality: Bilateral;   SENTINEL NODE BIOPSY N/A 05/16/2021   Procedure: SENTINEL NODE BIOPSY;  Surgeon: Lafonda Mosses, MD;  Location: [facility];  Service: Gynecology;  Laterality: N/A;    Family History  Problem Relation Age of Onset   Cancer Father  Gallbladder Cancer   Liver cancer Brother    Breast cancer Cousin    Colon cancer Neg Hx    Ovarian cancer Neg Hx    Endometrial cancer Neg Hx    Pancreatic cancer Neg Hx    Prostate cancer Neg Hx     Social History   Socioeconomic History   Marital status: Married    Spouse  name: Not on file   Number of children: Not on file   Years of education: Not on file   Highest education level: Not on file  Occupational History   Not on file  Tobacco Use   Smoking status: Never   Smokeless tobacco: Never  Vaping Use   Vaping Use: Never used  Substance and Sexual Activity   Alcohol use: Not Currently   Drug use: Not Currently   Sexual activity: Not Currently    Birth control/protection: None  Other Topics Concern   Not on file  Social History Narrative   Not on file   Social Determinants of Health   Financial Resource Strain: Not on file  Food Insecurity: Not on file  Transportation Needs: Not on file  Physical Activity: Not on file  Stress: Not on file  Social Connections: Not on file    Current Medications:  Current Outpatient Medications:    acetaminophen (TYLENOL) 500 MG tablet, Take 1,000 mg by mouth every 6 (six) hours as needed for moderate pain., Disp: , Rfl:    cyclobenzaprine (FLEXERIL) 10 MG tablet, Take 10 mg by mouth 3 (three) times daily as needed for muscle spasms., Disp: , Rfl:    DULoxetine (CYMBALTA) 60 MG capsule, Take 120 mg by mouth daily., Disp: , Rfl:    losartan (COZAAR) 100 MG tablet, Take 100 mg by mouth daily., Disp: , Rfl:    metFORMIN (GLUCOPHAGE) 1000 MG tablet, Take 1,000 mg by mouth 2 (two) times daily with a meal., Disp: , Rfl:    Multiple Vitamins-Minerals (CENTRUM SILVER 50+WOMEN) TABS, Take 1 tablet by mouth daily., Disp: , Rfl:    Oxycodone HCl 10 MG TABS, Take 10 mg by mouth 3 (three) times daily as needed (pain)., Disp: , Rfl:    OZEMPIC, 0.25 OR 0.5 MG/DOSE, 2 MG/1.5ML SOPN, Inject 0.25 mg into the skin every Wednesday., Disp: , Rfl:    senna-docusate (SENOKOT-S) 8.6-50 MG tablet, Take 2 tablets by mouth at bedtime. For AFTER surgery, do not take if having diarrhea, Disp: 30 tablet, Rfl: 0  Review of Symptoms: Pertinent positives as per HPI.  Physical Exam: LMP 05/14/2021  Deferred given limitations of phone  visit.  Laboratory & Radiologic Studies: See above  Assessment & Plan: Claire Arnold is a 61 y.o. woman with Stage IA grade 1 endometrioid endometrial adenocarcinoma who presents for follow-up by phone.  Doing well post-op and meeting milestones. Discussed continued expectations and restrictions.  Reviewed her pathology report in detail. Given no high risk features, her risk of recurrence is low and adjuvant treatment is not recommended. All questions answered.   I discussed the assessment and treatment plan with the patient. The patient was provided with an opportunity to ask questions and all were answered. The patient agreed with the plan and demonstrated an understanding of the instructions.   The patient was advised to call back or see an in-person evaluation if the symptoms worsen or if the condition fails to improve as anticipated.   16 minutes of total time was spent for this patient encounter, including preparation, phone  counseling with the patient and coordination of care, and documentation of the encounter.   Jeral Pinch, MD  Division of Gynecologic Oncology  Department of Obstetrics and Gynecology  [facility]

## 2021-05-23 ENCOUNTER — Encounter: Payer: Self-pay | Admitting: Gynecologic Oncology

## 2021-06-06 ENCOUNTER — Encounter: Payer: Self-pay | Admitting: Gynecologic Oncology

## 2021-06-08 ENCOUNTER — Inpatient Hospital Stay: Payer: Medicare Other | Attending: Gynecologic Oncology | Admitting: Gynecologic Oncology

## 2021-06-08 ENCOUNTER — Encounter: Payer: Self-pay | Admitting: Gynecologic Oncology

## 2021-06-08 ENCOUNTER — Other Ambulatory Visit: Payer: Self-pay

## 2021-06-08 VITALS — BP 168/82 | HR 104 | Temp 99.3°F | Resp 16 | Ht 62.6 in | Wt 308.3 lb

## 2021-06-08 DIAGNOSIS — Z7189 Other specified counseling: Secondary | ICD-10-CM

## 2021-06-08 DIAGNOSIS — C541 Malignant neoplasm of endometrium: Secondary | ICD-10-CM

## 2021-06-08 DIAGNOSIS — Z90722 Acquired absence of ovaries, bilateral: Secondary | ICD-10-CM

## 2021-06-08 DIAGNOSIS — Z6841 Body Mass Index (BMI) 40.0 and over, adult: Secondary | ICD-10-CM

## 2021-06-08 DIAGNOSIS — Z90710 Acquired absence of both cervix and uterus: Secondary | ICD-10-CM

## 2021-06-08 NOTE — Patient Instructions (Signed)
You are healing very well from surgery.  Remember, no heavy lifting until 6 weeks after surgery and nothing in the vagina for at least 8 weeks.  For follow-up, I recommend visits with an exam every 6 months.  This can either be with me or alternating between my office and your OB/GYN.  Between visits, if you develop any symptoms concerning for cancer recurrence (like vaginal bleeding, pelvic pain, unintentional weight loss), please call to see me sooner.

## 2021-06-08 NOTE — Progress Notes (Signed)
Gynecologic Oncology Return Clinic Visit  06/08/2021  Reason for Visit: Follow-up after surgery, treatment discussion  Treatment History: Oncology History Overview Note  MMR IHC normal   Endometrial cancer, grade I ([place])  04/23/2021 Initial Biopsy   EMB: showing endometrioid carcinoma partially in follow-up and endometrial polyp, FIGO grade 1.   05/07/2021 Initial Diagnosis   Endometrial cancer, grade I ([place])   05/16/2021 Surgery   Robotic-assisted laparoscopic total hysterectomy with bilateral salpingoophorectomy, SLN biopsy, hernia reduction, bladder serosal repair, vaginal laceration repair    Findings: On EUA, 10 cm mobile uterus. Normal upper abdomen including liver edge and diaphragm. Lap band in place. 2-3 cm umbilical hernia with significant amount of omentum within hernia. Additional omentum adherent to the anterior abdominal wall inferior to the umbilicus. Normal small and large bowel. Uterus 10cm, normal appearing. Normal appearing right adnexa. Left ovary somewhat adherent to the sigmoid colon mesentery. Mapping successful to right obturator and left external iliac SLN. Mild adhesions between bladder and cervix. 1cm area of serosal disruption noted to the bladder after creation of bladder flap, reinforced. No intra-abdominal or pelvic evidence of disease. Left side wall of the vagina and distal left vaginal lacerations repaired.    05/16/2021 Pathologic Stage   Stage IA, grade 1 MI 15% No LVI Neg SLNs     Interval History: Patient presents today for follow-up after surgery.  She reports overall doing very well.  She denies any vaginal bleeding or discharge.  She reports regular bowel and bladder function.  She is going to the gym now on some days and walking about 10 minutes on the treadmill.  She continues to have decreased appetite with the use of Ozempic.  Past Medical/Surgical History: Past Medical History:  Diagnosis Date   Anxiety    Arthritis    OA   Cancer ([place])     endometrial   Depression    Heart murmur    Worked up in [place] was fine   History of hiatal hernia    Hyperlipidemia    Hypertension    Obesity, morbid, BMI 50 or higher ([place])    Pneumonia    walking PNA   Type 2 diabetes mellitus ([place])     Past Surgical History:  Procedure Laterality Date   CESAREAN SECTION  11/13/1990   LAPAROSCOPIC GASTRIC BANDING     LIGAMENT REPAIR Right    right wrist   LYMPH NODE DISSECTION N/A 05/16/2021   Procedure: LYMPH NODE DISSECTION;  Surgeon: Lafonda Mosses, MD;  Location: [facility];  Service: Gynecology;  Laterality: N/A;   REPLACEMENT TOTAL KNEE Left 2000   ROBOTIC ASSISTED TOTAL HYSTERECTOMY WITH BILATERAL SALPINGO OOPHERECTOMY Bilateral 05/16/2021   Procedure: XI ROBOTIC ASSISTED TOTAL HYSTERECTOMY WITH BILATERAL SALPINGO OOPHORECTOMY;  Surgeon: Lafonda Mosses, MD;  Location: [facility];  Service: Gynecology;  Laterality: Bilateral;   SENTINEL NODE BIOPSY N/A 05/16/2021   Procedure: SENTINEL NODE BIOPSY;  Surgeon: Lafonda Mosses, MD;  Location: [facility];  Service: Gynecology;  Laterality: N/A;    Family History  Problem Relation Age of Onset   Cancer Father        Gallbladder Cancer   Liver cancer Brother    Breast cancer Cousin    Colon cancer Neg Hx    Ovarian cancer Neg Hx    Endometrial cancer Neg Hx    Pancreatic cancer Neg Hx    Prostate cancer Neg Hx     Social History   Socioeconomic History   Marital status:  Married    Spouse name: Not on file   Number of children: Not on file   Years of education: Not on file   Highest education level: Not on file  Occupational History   Not on file  Tobacco Use   Smoking status: Never   Smokeless tobacco: Never  Vaping Use   Vaping Use: Never used  Substance and Sexual Activity   Alcohol use: Not Currently   Drug use: Not Currently   Sexual activity: Not Currently    Birth control/protection: None  Other Topics Concern   Not on file  Social History Narrative   Not on  file   Social Determinants of Health   Financial Resource Strain: Not on file  Food Insecurity: Not on file  Transportation Needs: Not on file  Physical Activity: Not on file  Stress: Not on file  Social Connections: Not on file    Current Medications:  Current Outpatient Medications:    acetaminophen (TYLENOL) 500 MG tablet, Take 1,000 mg by mouth every 6 (six) hours as needed for moderate pain., Disp: , Rfl:    cyclobenzaprine (FLEXERIL) 10 MG tablet, Take 10 mg by mouth 3 (three) times daily as needed for muscle spasms., Disp: , Rfl:    DULoxetine (CYMBALTA) 60 MG capsule, Take 120 mg by mouth daily., Disp: , Rfl:    losartan (COZAAR) 100 MG tablet, Take 100 mg by mouth daily., Disp: , Rfl:    metFORMIN (GLUCOPHAGE) 1000 MG tablet, Take 1,000 mg by mouth 2 (two) times daily with a meal., Disp: , Rfl:    Multiple Vitamins-Minerals (CENTRUM SILVER 50+WOMEN) TABS, Take 1 tablet by mouth daily., Disp: , Rfl:    Oxycodone HCl 10 MG TABS, Take 10 mg by mouth 3 (three) times daily as needed (pain)., Disp: , Rfl:    OZEMPIC, 0.25 OR 0.5 MG/DOSE, 2 MG/1.5ML SOPN, Inject 0.25 mg into the skin every Wednesday., Disp: , Rfl:    senna-docusate (SENOKOT-S) 8.6-50 MG tablet, Take 2 tablets by mouth at bedtime. For AFTER surgery, do not take if having diarrhea, Disp: 30 tablet, Rfl: 0  Review of Systems: Denies appetite changes, fevers, chills, fatigue, unexplained weight changes. Denies hearing loss, neck lumps or masses, mouth sores, ringing in ears or voice changes. Denies cough or wheezing.  Denies shortness of breath. Denies chest pain or palpitations. Denies leg swelling. Denies abdominal distention, pain, blood in stools, constipation, diarrhea, nausea, vomiting, or early satiety. Denies pain with intercourse, dysuria, frequency, hematuria or incontinence. Denies hot flashes, pelvic pain, vaginal bleeding or vaginal discharge.   Denies joint pain, back pain or muscle pain/cramps. Denies  itching, rash, or wounds. Denies dizziness, headaches, numbness or seizures. Denies swollen lymph nodes or glands, denies easy bruising or bleeding. Denies anxiety, depression, confusion, or decreased concentration.  Physical Exam: BP (!) 168/82 (BP Location: Right Wrist, Patient Position: Sitting)    Pulse (!) 104    Temp 99.3 F (37.4 C) (Oral)    Resp 16    Ht 5' 2.6" (1.59 m)    Wt (!) 308 lb 4.8 oz (139.8 kg)    LMP 05/14/2021    SpO2 95%    BMI 55.32 kg/m  General: Alert, oriented, no acute distress. HEENT: Normocephalic, atraumatic, sclera anicteric. Chest: Unlabored breathing on room air. Abdomen: Obese, soft, nontender.  Normoactive bowel sounds.  No masses or hepatosplenomegaly appreciated.  Well-healing laparoscopic incisions. Extremities: Grossly normal range of motion.  Warm, well perfused.  No edema bilaterally. Skin: No  rashes or lesions noted. GU: Normal appearing external genitalia without erythema, excoriation, or lesions.  Speculum exam reveals cuff intact, no blood or discharge in the vault.  Sutures visible.  Bimanual exam reveals cuff intact, no tenderness or fluctuance.    Laboratory & Radiologic Studies: Microsatellite stable  Assessment & Plan: Claire Arnold is a 61 y.o. woman with Stage IA grade 1 endometrioid endometrial adenocarcinoma who presents for follow-up, treatment discussion. MS - stable.  Patient continues to do very well and is meeting postoperative milestones.  Discussed continued postoperative expectations as well as physical activity restrictions.  We discussed her pathology and again in detail.  Given low risk early stage cancer, no adjuvant treatment is recommended.  In terms of surveillance, we discussed NCCN recommendations which are for visits with an exam and symptom review every 6 months for 5 years and then yearly visits.  I offered that these visits could be with me every 6 months or alternating between me and her OB/GYN.  Her  preference is to alternate visits.  We will plan on having her see me in 6 months and then her OB/GYN at the following visit.  No routine Pap test screening is indicated.  Today, we reviewed signs and symptoms that would be concerning for cancer recurrence, and I stressed the importance of calling if she develops any of these between visits.  28 minutes of total time was spent for this patient encounter, including preparation, face-to-face counseling with the patient and coordination of care, and documentation of the encounter.  Jeral Pinch, MD  Division of Gynecologic Oncology  Department of Obstetrics and Gynecology  [facility]

## 2021-06-19 DIAGNOSIS — G894 Chronic pain syndrome: Secondary | ICD-10-CM | POA: Diagnosis not present

## 2021-06-19 DIAGNOSIS — M545 Low back pain, unspecified: Secondary | ICD-10-CM | POA: Diagnosis not present

## 2021-06-19 DIAGNOSIS — Z79891 Long term (current) use of opiate analgesic: Secondary | ICD-10-CM | POA: Diagnosis not present

## 2021-07-10 DIAGNOSIS — I10 Essential (primary) hypertension: Secondary | ICD-10-CM | POA: Diagnosis not present

## 2021-07-10 DIAGNOSIS — E1165 Type 2 diabetes mellitus with hyperglycemia: Secondary | ICD-10-CM | POA: Diagnosis not present

## 2021-07-17 DIAGNOSIS — E1165 Type 2 diabetes mellitus with hyperglycemia: Secondary | ICD-10-CM | POA: Diagnosis not present

## 2021-08-10 DIAGNOSIS — G4700 Insomnia, unspecified: Secondary | ICD-10-CM | POA: Diagnosis not present

## 2021-08-10 DIAGNOSIS — F411 Generalized anxiety disorder: Secondary | ICD-10-CM | POA: Diagnosis not present

## 2021-08-10 DIAGNOSIS — F4000 Agoraphobia, unspecified: Secondary | ICD-10-CM | POA: Diagnosis not present

## 2021-08-10 DIAGNOSIS — E1165 Type 2 diabetes mellitus with hyperglycemia: Secondary | ICD-10-CM | POA: Diagnosis not present

## 2021-08-10 DIAGNOSIS — F331 Major depressive disorder, recurrent, moderate: Secondary | ICD-10-CM | POA: Diagnosis not present

## 2021-09-18 ENCOUNTER — Encounter: Payer: Self-pay | Admitting: Gynecologic Oncology

## 2021-09-18 DIAGNOSIS — Z79891 Long term (current) use of opiate analgesic: Secondary | ICD-10-CM | POA: Diagnosis not present

## 2021-09-18 DIAGNOSIS — G894 Chronic pain syndrome: Secondary | ICD-10-CM | POA: Diagnosis not present

## 2021-09-18 DIAGNOSIS — M545 Low back pain, unspecified: Secondary | ICD-10-CM | POA: Diagnosis not present

## 2021-09-20 NOTE — Progress Notes (Unsigned)
Gynecologic Oncology Return Clinic Visit  09/21/21  Reason for Visit: Surveillance for endometrial cancer  Treatment History: Oncology History Overview Note  MMR IHC normal   Endometrial cancer, grade I ([place])  04/23/2021 Initial Biopsy   EMB: showing endometrioid carcinoma partially in follow-up and endometrial polyp, FIGO grade 1.   05/07/2021 Initial Diagnosis   Endometrial cancer, grade I ([place])   05/16/2021 Surgery   Robotic-assisted laparoscopic total hysterectomy with bilateral salpingoophorectomy, SLN biopsy, hernia reduction, bladder serosal repair, vaginal laceration repair    Findings: On EUA, 10 cm mobile uterus. Normal upper abdomen including liver edge and diaphragm. Lap band in place. 2-3 cm umbilical hernia with significant amount of omentum within hernia. Additional omentum adherent to the anterior abdominal wall inferior to the umbilicus. Normal small and large bowel. Uterus 10cm, normal appearing. Normal appearing right adnexa. Left ovary somewhat adherent to the sigmoid colon mesentery. Mapping successful to right obturator and left external iliac SLN. Mild adhesions between bladder and cervix. 1cm area of serosal disruption noted to the bladder after creation of bladder flap, reinforced. No intra-abdominal or pelvic evidence of disease. Left side wall of the vagina and distal left vaginal lacerations repaired.    05/16/2021 Pathologic Stage   Stage IA, grade 1 MI 15% No LVI Neg SLNs     Interval History: ***  Past Medical/Surgical History: Past Medical History:  Diagnosis Date   Anxiety    Arthritis    OA   Cancer ([place])    endometrial   Depression    Heart murmur    Worked up in [place] was fine   History of hiatal hernia    Hyperlipidemia    Hypertension    Obesity, morbid, BMI 50 or higher ([place])    Pneumonia    walking PNA   Type 2 diabetes mellitus ([place])     Past Surgical History:  Procedure Laterality Date   CESAREAN SECTION  11/13/1990    LAPAROSCOPIC GASTRIC BANDING     LIGAMENT REPAIR Right    right wrist   LYMPH NODE DISSECTION N/A 05/16/2021   Procedure: LYMPH NODE DISSECTION;  Surgeon: Lafonda Mosses, MD;  [facility]: [facility];  Service: Gynecology;  Laterality: N/A;   REPLACEMENT TOTAL KNEE Left 2000   ROBOTIC ASSISTED TOTAL HYSTERECTOMY WITH BILATERAL SALPINGO OOPHERECTOMY Bilateral 05/16/2021   Procedure: XI ROBOTIC ASSISTED TOTAL HYSTERECTOMY WITH BILATERAL SALPINGO OOPHORECTOMY;  Surgeon: Lafonda Mosses, MD;  Location: [facility];  Service: Gynecology;  Laterality: Bilateral;   SENTINEL NODE BIOPSY N/A 05/16/2021   Procedure: SENTINEL NODE BIOPSY;  Surgeon: Lafonda Mosses, MD;  [facility];  Service: Gynecology;  Laterality: N/A;    Family History  Problem Relation Age of Onset   Cancer Father        Gallbladder Cancer   Liver cancer Brother    Breast cancer Cousin    Colon cancer Neg Hx    Ovarian cancer Neg Hx    Endometrial cancer Neg Hx    Pancreatic cancer Neg Hx    Prostate cancer Neg Hx     Social History   Socioeconomic History   Marital status: Married    Spouse name: Not on file   Number of children: Not on file   Years of education: Not on file   Highest education level: Not on file  Occupational History   Not on file  Tobacco Use   Smoking status: Never   Smokeless tobacco: Never  Vaping Use   Vaping Use:  Never used  Substance and Sexual Activity   Alcohol use: Not Currently   Drug use: Not Currently   Sexual activity: Not Currently    Birth control/protection: None  Other Topics Concern   Not on file  Social History Narrative   Not on file   Social Determinants of Health   Financial Resource Strain: Not on file  Food Insecurity: Not on file  Transportation Needs: Not on file  Physical Activity: Not on file  Stress: Not on file  Social Connections: Not on file    Current Medications:  Current Outpatient Medications:    acetaminophen (TYLENOL) 500 MG tablet,  Take 1,000 mg by mouth every 6 (six) hours as needed for moderate pain., Disp: , Rfl:    cyclobenzaprine (FLEXERIL) 10 MG tablet, Take 10 mg by mouth 3 (three) times daily as needed for muscle spasms., Disp: , Rfl:    DULoxetine (CYMBALTA) 60 MG capsule, Take 120 mg by mouth daily., Disp: , Rfl:    losartan (COZAAR) 100 MG tablet, Take 100 mg by mouth daily., Disp: , Rfl:    Multiple Vitamins-Minerals (CENTRUM SILVER 50+WOMEN) TABS, Take 1 tablet by mouth daily., Disp: , Rfl:    Oxycodone HCl 10 MG TABS, Take 10 mg by mouth 3 (three) times daily as needed (pain)., Disp: , Rfl:    OZEMPIC, 0.25 OR 0.5 MG/DOSE, 2 MG/1.5ML SOPN, Inject 0.25 mg into the skin every Wednesday., Disp: , Rfl:   Review of Systems: Denies appetite changes, fevers, chills, fatigue, unexplained weight changes. Denies hearing loss, neck lumps or masses, mouth sores, ringing in ears or voice changes. Denies cough or wheezing.  Denies shortness of breath. Denies chest pain or palpitations. Denies leg swelling. Denies abdominal distention, pain, blood in stools, constipation, diarrhea, nausea, vomiting, or early satiety. Denies pain with intercourse, dysuria, frequency, hematuria or incontinence. Denies hot flashes, pelvic pain, vaginal bleeding or vaginal discharge.   Denies joint pain, back pain or muscle pain/cramps. Denies itching, rash, or wounds. Denies dizziness, headaches, numbness or seizures. Denies swollen lymph nodes or glands, denies easy bruising or bleeding. Denies anxiety, depression, confusion, or decreased concentration.  Physical Exam: There were no vitals taken for this visit. General: ***Alert, oriented, no acute distress. HEENT: ***Posterior oropharynx clear, sclera anicteric. Chest: ***Clear to auscultation bilaterally.  ***Port site clean. Cardiovascular: ***Regular rate and rhythm, no murmurs. Abdomen: ***Obese, soft, nontender.  Normoactive bowel sounds.  No masses or hepatosplenomegaly  appreciated.  ***Well-healed scar. Extremities: ***Grossly normal range of motion.  Warm, well perfused.  No edema bilaterally. Skin: ***No rashes or lesions noted. Lymphatics: ***No cervical, supraclavicular, or inguinal adenopathy. GU: Normal appearing external genitalia without erythema, excoriation, or lesions.  Speculum exam reveals ***.  Bimanual exam reveals ***.  ***Rectovaginal exam  confirms ___.  Laboratory & Radiologic Studies: ***  Assessment & Plan: Casara Perrier is a 61 y.o. woman with Stage IA grade 1 endometrioid endometrial adenocarcinoma who presents for surveillance. Definitive surgery 04/2021.  MS - stable.   Patient continues to do very well and is meeting postoperative milestones.  Discussed continued postoperative expectations as well as physical activity restrictions.   We discussed her pathology and again in detail.  Given low risk early stage cancer, no adjuvant treatment is recommended.  In terms of surveillance, we discussed NCCN recommendations which are for visits with an exam and symptom review every 6 months for 5 years and then yearly visits.  I offered that these visits could be with me every  6 months or alternating between me and her OB/GYN.  Her preference is to alternate visits.  We will plan on having her see me in 6 months and then her OB/GYN at the following visit.  No routine Pap test screening is indicated.  *** minutes of total time was spent for this patient encounter, including preparation, face-to-face counseling with the patient and coordination of care, and documentation of the encounter.  Jeral Pinch, MD  Division of Gynecologic Oncology  Department of Obstetrics and Gynecology  [facility]

## 2021-09-21 ENCOUNTER — Inpatient Hospital Stay: Payer: Medicare Other | Attending: Gynecologic Oncology | Admitting: Gynecologic Oncology

## 2021-09-21 DIAGNOSIS — C541 Malignant neoplasm of endometrium: Secondary | ICD-10-CM

## 2021-09-24 ENCOUNTER — Telehealth: Payer: Self-pay | Admitting: *Deleted

## 2021-09-24 NOTE — Telephone Encounter (Signed)
Returning pt's phone call to reschedule her missed appt from last week due to vomiting. Unable to reach pt. LVM for return call.

## 2021-09-25 NOTE — Telephone Encounter (Signed)
Unable to reach pt this morning to reschedule missed appt from last week. LVM for return call.

## 2021-10-12 DIAGNOSIS — E782 Mixed hyperlipidemia: Secondary | ICD-10-CM | POA: Diagnosis not present

## 2021-10-12 DIAGNOSIS — I10 Essential (primary) hypertension: Secondary | ICD-10-CM | POA: Diagnosis not present

## 2021-10-12 DIAGNOSIS — E1165 Type 2 diabetes mellitus with hyperglycemia: Secondary | ICD-10-CM | POA: Diagnosis not present

## 2021-10-17 DIAGNOSIS — E119 Type 2 diabetes mellitus without complications: Secondary | ICD-10-CM | POA: Diagnosis not present

## 2021-10-17 DIAGNOSIS — H5203 Hypermetropia, bilateral: Secondary | ICD-10-CM | POA: Diagnosis not present

## 2021-10-24 ENCOUNTER — Encounter: Payer: Self-pay | Admitting: Gynecologic Oncology

## 2021-10-26 ENCOUNTER — Inpatient Hospital Stay: Payer: Medicare Other | Attending: Gynecologic Oncology | Admitting: Gynecologic Oncology

## 2021-10-26 ENCOUNTER — Other Ambulatory Visit: Payer: Self-pay

## 2021-10-26 ENCOUNTER — Encounter: Payer: Self-pay | Admitting: Gynecologic Oncology

## 2021-10-26 VITALS — BP 145/85 | HR 95 | Temp 98.8°F | Resp 16 | Ht 63.0 in | Wt 315.6 lb

## 2021-10-26 DIAGNOSIS — Z90722 Acquired absence of ovaries, bilateral: Secondary | ICD-10-CM | POA: Insufficient documentation

## 2021-10-26 DIAGNOSIS — Z90710 Acquired absence of both cervix and uterus: Secondary | ICD-10-CM | POA: Diagnosis not present

## 2021-10-26 DIAGNOSIS — C541 Malignant neoplasm of endometrium: Secondary | ICD-10-CM

## 2021-10-26 DIAGNOSIS — Z8542 Personal history of malignant neoplasm of other parts of uterus: Secondary | ICD-10-CM | POA: Insufficient documentation

## 2021-10-26 DIAGNOSIS — Z6841 Body Mass Index (BMI) 40.0 and over, adult: Secondary | ICD-10-CM

## 2021-10-26 NOTE — Progress Notes (Signed)
Gynecologic Oncology Return Clinic Visit  10/26/21  Reason for Visit: Surveillance for endometrial cancer  Treatment History: Oncology History Overview Note  MMR IHC normal   Endometrial cancer, grade I ([place])  04/23/2021 Initial Biopsy   EMB: showing endometrioid carcinoma partially in follow-up and endometrial polyp, FIGO grade 1.   05/07/2021 Initial Diagnosis   Endometrial cancer, grade I ([place])   05/16/2021 Surgery   Robotic-assisted laparoscopic total hysterectomy with bilateral salpingoophorectomy, SLN biopsy, hernia reduction, bladder serosal repair, vaginal laceration repair    Findings: On EUA, 10 cm mobile uterus. Normal upper abdomen including liver edge and diaphragm. Lap band in place. 2-3 cm umbilical hernia with significant amount of omentum within hernia. Additional omentum adherent to the anterior abdominal wall inferior to the umbilicus. Normal small and large bowel. Uterus 10cm, normal appearing. Normal appearing right adnexa. Left ovary somewhat adherent to the sigmoid colon mesentery. Mapping successful to right obturator and left external iliac SLN. Mild adhesions between bladder and cervix. 1cm area of serosal disruption noted to the bladder after creation of bladder flap, reinforced. No intra-abdominal or pelvic evidence of disease. Left side wall of the vagina and distal left vaginal lacerations repaired.    05/16/2021 Pathologic Stage   Stage IA, grade 1 MI 15% No LVI Neg SLNs     Interval History: Doing well.  Denies any abdominal pain or pelvic pain.  Denies any bleeding or discharge.  Reports regular bowel and bladder function.  Is working on weight loss again, having her Ozempic dose increased.  Sometimes notices her umbilical hernia, denies ever having a bulge or feeling pain related to this.  Past Medical/Surgical History: Past Medical History:  Diagnosis Date   Anxiety    Arthritis    OA   Cancer ([place])    endometrial   Depression    Heart murmur     Worked up in [place] was fine   History of hiatal hernia    Hyperlipidemia    Hypertension    Obesity, morbid, BMI 50 or higher ([place])    Pneumonia    walking PNA   Type 2 diabetes mellitus ([place])     Past Surgical History:  Procedure Laterality Date   CESAREAN SECTION  11/13/1990   LAPAROSCOPIC GASTRIC BANDING     LIGAMENT REPAIR Right    right wrist   LYMPH NODE DISSECTION N/A 05/16/2021   Procedure: LYMPH NODE DISSECTION;  Surgeon: Lafonda Mosses, MD;  Location: [facility];  Service: Gynecology;  Laterality: N/A;   REPLACEMENT TOTAL KNEE Left 2000   ROBOTIC ASSISTED TOTAL HYSTERECTOMY WITH BILATERAL SALPINGO OOPHERECTOMY Bilateral 05/16/2021   Procedure: XI ROBOTIC ASSISTED TOTAL HYSTERECTOMY WITH BILATERAL SALPINGO OOPHORECTOMY;  Surgeon: Lafonda Mosses, MD;  Location: [facility];  Service: Gynecology;  Laterality: Bilateral;   SENTINEL NODE BIOPSY N/A 05/16/2021   Procedure: SENTINEL NODE BIOPSY;  Surgeon: Lafonda Mosses, MD;  Location: [facility];  Service: Gynecology;  Laterality: N/A;    Family History  Problem Relation Age of Onset   Cancer Father        Gallbladder Cancer   Liver cancer Brother    Breast cancer Cousin    Colon cancer Neg Hx    Ovarian cancer Neg Hx    Endometrial cancer Neg Hx    Pancreatic cancer Neg Hx    Prostate cancer Neg Hx     Social History   Socioeconomic History   Marital status: Married    Spouse name: Not on file  Number of children: Not on file   Years of education: Not on file   Highest education level: Not on file  Occupational History   Not on file  Tobacco Use   Smoking status: Never   Smokeless tobacco: Never  Vaping Use   Vaping Use: Never used  Substance and Sexual Activity   Alcohol use: Not Currently   Drug use: Not Currently   Sexual activity: Not Currently    Birth control/protection: None  Other Topics Concern   Not on file  Social History Narrative   Not on file   Social Determinants of Health    Financial Resource Strain: Not on file  Food Insecurity: Not on file  Transportation Needs: Not on file  Physical Activity: Not on file  Stress: Not on file  Social Connections: Not on file    Current Medications:  Current Outpatient Medications:    acetaminophen (TYLENOL) 500 MG tablet, Take 1,000 mg by mouth every 6 (six) hours as needed for moderate pain., Disp: , Rfl:    cyclobenzaprine (FLEXERIL) 10 MG tablet, Take 10 mg by mouth 3 (three) times daily as needed for muscle spasms., Disp: , Rfl:    DULoxetine (CYMBALTA) 60 MG capsule, Take 120 mg by mouth daily., Disp: , Rfl:    losartan (COZAAR) 100 MG tablet, Take 100 mg by mouth daily., Disp: , Rfl:    Multiple Vitamins-Minerals (CENTRUM SILVER 50+WOMEN) TABS, Take 1 tablet by mouth daily., Disp: , Rfl:    Oxycodone HCl 10 MG TABS, Take 10 mg by mouth 3 (three) times daily as needed (pain)., Disp: , Rfl:    OZEMPIC, 0.25 OR 0.5 MG/DOSE, 2 MG/1.5ML SOPN, Inject 0.25 mg into the skin every Wednesday., Disp: , Rfl:   Review of Systems: + Decreased appetite on Ozempic, headache from allergies, anxiety, depression. Denies fevers, chills, fatigue, unexplained weight changes. Denies hearing loss, neck lumps or masses, mouth sores, ringing in ears or voice changes. Denies cough or wheezing.  Denies shortness of breath. Denies chest pain or palpitations. Denies leg swelling. Denies abdominal distention, pain, blood in stools, constipation, diarrhea, nausea, vomiting, or early satiety. Denies pain with intercourse, dysuria, frequency, hematuria or incontinence. Denies hot flashes, pelvic pain, vaginal bleeding or vaginal discharge.   Denies joint pain, back pain or muscle pain/cramps. Denies itching, rash, or wounds. Denies dizziness, numbness or seizures. Denies swollen lymph nodes or glands, denies easy bruising or bleeding. Denies confusion, or decreased concentration.  Physical Exam: BP (!) 145/85 (BP Location: Left Arm,  Patient Position: Sitting)   Pulse 95   Temp 98.8 F (37.1 C) (Oral)   Resp 16   Ht '5\' 3"'  (1.6 m)   Wt (!) 315 lb 9.6 oz (143.2 kg)   SpO2 93%   BMI 55.91 kg/m  General: Alert, oriented, no acute distress. HEENT: Normocephalic, atraumatic, sclera anicteric. Chest: Clear to auscultation bilaterally.  No wheezes or rhonchi. Cardiovascular: Regular rate and rhythm, no murmurs. Abdomen: Obese, soft, nontender.  Normoactive bowel sounds.  No masses or hepatosplenomegaly appreciated.  Well-healed incisions. Extremities: Grossly normal range of motion.  Warm, well perfused.  No edema bilaterally. Skin: No rashes or lesions noted. Lymphatics: No cervical, supraclavicular, or inguinal adenopathy. GU: Normal appearing external genitalia without erythema, excoriation, or lesions.  Speculum exam reveals well rugated vaginal mucosa, cuff intact without lesions.  Bimanual exam reveals cuff intact, no nodularity or masses.  Rectovaginal exam confirms these findings.  Laboratory & Radiologic Studies: None new  Assessment & Plan:  Claire Arnold is a 61 y.o. woman with Stage IA grade 1 endometrioid endometrial adenocarcinoma who presents for surveillance. Definitive surgery 04/2021.  MS - stable.   Patient is overall doing well and NED on exam today.   Given low risk early stage cancer, no adjuvant treatment was indicated.  We discussed NCCN recommendations given today which are for visits with an exam and symptom review every 6 months for 5 years and then yearly visits.  Given her low risk disease, I recommended that this be alternated visits between my office and her OB/GYN.  I have asked her to call her OB/GYN to get a visit scheduled for the end of the year or early January.  She will then call me sometime after the new year to get a visit scheduled around the same time next year.    No routine Pap test screening is indicated.  Patient continues on Ozempic.  I gave encouragement  regarding working towards weight loss.  We reviewed signs and symptoms that would be concerning for cancer recurrence and I stressed the importance of calling me if she develops any of these between visits.  22 minutes of total time was spent for this patient encounter, including preparation, face-to-face counseling with the patient and coordination of care, and documentation of the encounter.  Jeral Pinch, MD  Division of Gynecologic Oncology  Department of Obstetrics and Gynecology  [facility]

## 2021-10-26 NOTE — Patient Instructions (Signed)
It was good to see you today.  I do not see or feel any evidence of cancer recurrence on your exam.  Please reach out to your OB/GYN if you have not already to schedule a visit for follow-up in 6 months.  Please call my office sometime after the new year to get a visit scheduled to see me at the end of June or early July next year.  We will plan to continue visits every 6 months alternating between the 2 offices until 5 years after your surgery.  As always, please call if you develop any symptoms concerning for possible cancer recurrence.

## 2021-11-27 DIAGNOSIS — E782 Mixed hyperlipidemia: Secondary | ICD-10-CM | POA: Diagnosis not present

## 2021-11-27 DIAGNOSIS — E1165 Type 2 diabetes mellitus with hyperglycemia: Secondary | ICD-10-CM | POA: Diagnosis not present

## 2021-11-27 DIAGNOSIS — I10 Essential (primary) hypertension: Secondary | ICD-10-CM | POA: Diagnosis not present

## 2021-11-30 DIAGNOSIS — E782 Mixed hyperlipidemia: Secondary | ICD-10-CM | POA: Diagnosis not present

## 2021-11-30 DIAGNOSIS — E1165 Type 2 diabetes mellitus with hyperglycemia: Secondary | ICD-10-CM | POA: Diagnosis not present

## 2021-11-30 DIAGNOSIS — I10 Essential (primary) hypertension: Secondary | ICD-10-CM | POA: Diagnosis not present

## 2021-12-11 ENCOUNTER — Other Ambulatory Visit: Payer: Self-pay | Admitting: Family Medicine

## 2021-12-11 DIAGNOSIS — Z1339 Encounter for screening examination for other mental health and behavioral disorders: Secondary | ICD-10-CM | POA: Diagnosis not present

## 2021-12-11 DIAGNOSIS — Z1231 Encounter for screening mammogram for malignant neoplasm of breast: Secondary | ICD-10-CM

## 2021-12-11 DIAGNOSIS — I10 Essential (primary) hypertension: Secondary | ICD-10-CM | POA: Diagnosis not present

## 2021-12-11 DIAGNOSIS — E119 Type 2 diabetes mellitus without complications: Secondary | ICD-10-CM | POA: Diagnosis not present

## 2021-12-11 DIAGNOSIS — Z0000 Encounter for general adult medical examination without abnormal findings: Secondary | ICD-10-CM | POA: Diagnosis not present

## 2021-12-11 DIAGNOSIS — E785 Hyperlipidemia, unspecified: Secondary | ICD-10-CM | POA: Diagnosis not present

## 2021-12-11 DIAGNOSIS — Z1331 Encounter for screening for depression: Secondary | ICD-10-CM | POA: Diagnosis not present

## 2022-01-14 DIAGNOSIS — Z5181 Encounter for therapeutic drug level monitoring: Secondary | ICD-10-CM | POA: Diagnosis not present

## 2022-01-14 DIAGNOSIS — Z79891 Long term (current) use of opiate analgesic: Secondary | ICD-10-CM | POA: Diagnosis not present

## 2022-01-14 DIAGNOSIS — M545 Low back pain, unspecified: Secondary | ICD-10-CM | POA: Diagnosis not present

## 2022-01-14 DIAGNOSIS — Z79899 Other long term (current) drug therapy: Secondary | ICD-10-CM | POA: Diagnosis not present

## 2022-02-20 DIAGNOSIS — M25531 Pain in right wrist: Secondary | ICD-10-CM | POA: Diagnosis not present

## 2022-03-20 ENCOUNTER — Ambulatory Visit
Admission: RE | Admit: 2022-03-20 | Discharge: 2022-03-20 | Disposition: A | Discharge status: Home / Self Care | Payer: Medicare Other | Source: Ambulatory Visit | Attending: Family Medicine | Admitting: Family Medicine

## 2022-03-20 DIAGNOSIS — Z1231 Encounter for screening mammogram for malignant neoplasm of breast: Secondary | ICD-10-CM

## 2022-03-22 DIAGNOSIS — M25531 Pain in right wrist: Secondary | ICD-10-CM | POA: Diagnosis not present

## 2022-03-25 DIAGNOSIS — I10 Essential (primary) hypertension: Secondary | ICD-10-CM | POA: Diagnosis not present

## 2022-03-25 DIAGNOSIS — E1165 Type 2 diabetes mellitus with hyperglycemia: Secondary | ICD-10-CM | POA: Diagnosis not present

## 2022-03-25 DIAGNOSIS — E782 Mixed hyperlipidemia: Secondary | ICD-10-CM | POA: Diagnosis not present

## 2022-04-01 DIAGNOSIS — E782 Mixed hyperlipidemia: Secondary | ICD-10-CM | POA: Diagnosis not present

## 2022-04-01 DIAGNOSIS — I10 Essential (primary) hypertension: Secondary | ICD-10-CM | POA: Diagnosis not present

## 2022-04-01 DIAGNOSIS — E1165 Type 2 diabetes mellitus with hyperglycemia: Secondary | ICD-10-CM | POA: Diagnosis not present

## 2022-04-01 DIAGNOSIS — E781 Pure hyperglyceridemia: Secondary | ICD-10-CM | POA: Diagnosis not present

## 2022-05-09 DIAGNOSIS — M545 Low back pain, unspecified: Secondary | ICD-10-CM | POA: Diagnosis not present

## 2022-05-09 DIAGNOSIS — Z79891 Long term (current) use of opiate analgesic: Secondary | ICD-10-CM | POA: Diagnosis not present

## 2022-05-09 DIAGNOSIS — G894 Chronic pain syndrome: Secondary | ICD-10-CM | POA: Diagnosis not present

## 2022-06-13 DIAGNOSIS — Z8542 Personal history of malignant neoplasm of other parts of uterus: Secondary | ICD-10-CM | POA: Diagnosis not present

## 2022-07-04 DIAGNOSIS — E782 Mixed hyperlipidemia: Secondary | ICD-10-CM | POA: Diagnosis not present

## 2022-07-04 DIAGNOSIS — E1165 Type 2 diabetes mellitus with hyperglycemia: Secondary | ICD-10-CM | POA: Diagnosis not present

## 2022-07-04 DIAGNOSIS — I10 Essential (primary) hypertension: Secondary | ICD-10-CM | POA: Diagnosis not present

## 2022-07-04 DIAGNOSIS — E039 Hypothyroidism, unspecified: Secondary | ICD-10-CM | POA: Diagnosis not present

## 2022-07-10 DIAGNOSIS — E782 Mixed hyperlipidemia: Secondary | ICD-10-CM | POA: Diagnosis not present

## 2022-07-10 DIAGNOSIS — E1165 Type 2 diabetes mellitus with hyperglycemia: Secondary | ICD-10-CM | POA: Diagnosis not present

## 2022-07-10 DIAGNOSIS — G4733 Obstructive sleep apnea (adult) (pediatric): Secondary | ICD-10-CM | POA: Diagnosis not present

## 2022-07-10 DIAGNOSIS — I10 Essential (primary) hypertension: Secondary | ICD-10-CM | POA: Diagnosis not present

## 2022-07-10 DIAGNOSIS — E781 Pure hyperglyceridemia: Secondary | ICD-10-CM | POA: Diagnosis not present

## 2022-09-11 DIAGNOSIS — G894 Chronic pain syndrome: Secondary | ICD-10-CM | POA: Diagnosis not present

## 2022-09-11 DIAGNOSIS — Z5181 Encounter for therapeutic drug level monitoring: Secondary | ICD-10-CM | POA: Diagnosis not present

## 2022-09-11 DIAGNOSIS — Z79899 Other long term (current) drug therapy: Secondary | ICD-10-CM | POA: Diagnosis not present

## 2022-09-11 DIAGNOSIS — M545 Low back pain, unspecified: Secondary | ICD-10-CM | POA: Diagnosis not present

## 2022-09-11 DIAGNOSIS — Z79891 Long term (current) use of opiate analgesic: Secondary | ICD-10-CM | POA: Diagnosis not present

## 2022-10-08 DIAGNOSIS — E1165 Type 2 diabetes mellitus with hyperglycemia: Secondary | ICD-10-CM | POA: Diagnosis not present

## 2022-10-08 DIAGNOSIS — E782 Mixed hyperlipidemia: Secondary | ICD-10-CM | POA: Diagnosis not present

## 2022-10-14 DIAGNOSIS — E1165 Type 2 diabetes mellitus with hyperglycemia: Secondary | ICD-10-CM | POA: Diagnosis not present

## 2022-10-14 DIAGNOSIS — E782 Mixed hyperlipidemia: Secondary | ICD-10-CM | POA: Diagnosis not present

## 2022-10-14 DIAGNOSIS — I10 Essential (primary) hypertension: Secondary | ICD-10-CM | POA: Diagnosis not present

## 2022-10-14 DIAGNOSIS — Z6841 Body Mass Index (BMI) 40.0 and over, adult: Secondary | ICD-10-CM | POA: Diagnosis not present

## 2022-10-31 DIAGNOSIS — M25532 Pain in left wrist: Secondary | ICD-10-CM | POA: Diagnosis not present

## 2022-11-15 DIAGNOSIS — M9901 Segmental and somatic dysfunction of cervical region: Secondary | ICD-10-CM | POA: Diagnosis not present

## 2022-11-15 DIAGNOSIS — M5412 Radiculopathy, cervical region: Secondary | ICD-10-CM | POA: Diagnosis not present

## 2022-11-15 DIAGNOSIS — M62830 Muscle spasm of back: Secondary | ICD-10-CM | POA: Diagnosis not present

## 2022-11-15 DIAGNOSIS — M542 Cervicalgia: Secondary | ICD-10-CM | POA: Diagnosis not present

## 2022-11-18 DIAGNOSIS — M9901 Segmental and somatic dysfunction of cervical region: Secondary | ICD-10-CM | POA: Diagnosis not present

## 2022-11-18 DIAGNOSIS — M542 Cervicalgia: Secondary | ICD-10-CM | POA: Diagnosis not present

## 2022-11-18 DIAGNOSIS — M5412 Radiculopathy, cervical region: Secondary | ICD-10-CM | POA: Diagnosis not present

## 2022-11-18 DIAGNOSIS — M62830 Muscle spasm of back: Secondary | ICD-10-CM | POA: Diagnosis not present

## 2022-12-30 DIAGNOSIS — Z8542 Personal history of malignant neoplasm of other parts of uterus: Secondary | ICD-10-CM | POA: Diagnosis not present

## 2023-01-16 DIAGNOSIS — R0683 Snoring: Secondary | ICD-10-CM | POA: Diagnosis not present

## 2023-01-16 DIAGNOSIS — Z0000 Encounter for general adult medical examination without abnormal findings: Secondary | ICD-10-CM | POA: Diagnosis not present

## 2023-01-16 DIAGNOSIS — Z1331 Encounter for screening for depression: Secondary | ICD-10-CM | POA: Diagnosis not present

## 2023-01-16 DIAGNOSIS — G4733 Obstructive sleep apnea (adult) (pediatric): Secondary | ICD-10-CM | POA: Diagnosis not present

## 2023-01-16 DIAGNOSIS — Z1339 Encounter for screening examination for other mental health and behavioral disorders: Secondary | ICD-10-CM | POA: Diagnosis not present

## 2023-02-19 DIAGNOSIS — Z79891 Long term (current) use of opiate analgesic: Secondary | ICD-10-CM | POA: Diagnosis not present

## 2023-02-19 DIAGNOSIS — M545 Low back pain, unspecified: Secondary | ICD-10-CM | POA: Diagnosis not present

## 2023-02-19 DIAGNOSIS — G894 Chronic pain syndrome: Secondary | ICD-10-CM | POA: Diagnosis not present

## 2023-02-24 DIAGNOSIS — E785 Hyperlipidemia, unspecified: Secondary | ICD-10-CM | POA: Diagnosis not present

## 2023-02-24 DIAGNOSIS — R5383 Other fatigue: Secondary | ICD-10-CM | POA: Diagnosis not present

## 2023-02-24 DIAGNOSIS — R739 Hyperglycemia, unspecified: Secondary | ICD-10-CM | POA: Diagnosis not present

## 2023-02-24 DIAGNOSIS — E559 Vitamin D deficiency, unspecified: Secondary | ICD-10-CM | POA: Diagnosis not present

## 2023-03-01 DIAGNOSIS — G4733 Obstructive sleep apnea (adult) (pediatric): Secondary | ICD-10-CM | POA: Diagnosis not present

## 2023-03-13 DIAGNOSIS — E782 Mixed hyperlipidemia: Secondary | ICD-10-CM | POA: Diagnosis not present

## 2023-03-13 DIAGNOSIS — E1121 Type 2 diabetes mellitus with diabetic nephropathy: Secondary | ICD-10-CM | POA: Diagnosis not present

## 2023-03-13 DIAGNOSIS — E559 Vitamin D deficiency, unspecified: Secondary | ICD-10-CM | POA: Diagnosis not present

## 2023-03-13 DIAGNOSIS — Z789 Other specified health status: Secondary | ICD-10-CM | POA: Diagnosis not present

## 2023-03-13 DIAGNOSIS — E1165 Type 2 diabetes mellitus with hyperglycemia: Secondary | ICD-10-CM | POA: Diagnosis not present

## 2023-03-25 ENCOUNTER — Other Ambulatory Visit: Payer: Self-pay | Admitting: Family Medicine

## 2023-03-25 DIAGNOSIS — Z1231 Encounter for screening mammogram for malignant neoplasm of breast: Secondary | ICD-10-CM

## 2023-03-26 ENCOUNTER — Ambulatory Visit: Payer: Medicare Other

## 2023-03-31 DIAGNOSIS — I10 Essential (primary) hypertension: Secondary | ICD-10-CM | POA: Diagnosis not present

## 2023-03-31 DIAGNOSIS — E1122 Type 2 diabetes mellitus with diabetic chronic kidney disease: Secondary | ICD-10-CM | POA: Diagnosis not present

## 2023-03-31 DIAGNOSIS — E1165 Type 2 diabetes mellitus with hyperglycemia: Secondary | ICD-10-CM | POA: Diagnosis not present

## 2023-03-31 DIAGNOSIS — I129 Hypertensive chronic kidney disease with stage 1 through stage 4 chronic kidney disease, or unspecified chronic kidney disease: Secondary | ICD-10-CM | POA: Diagnosis not present

## 2023-03-31 DIAGNOSIS — E1121 Type 2 diabetes mellitus with diabetic nephropathy: Secondary | ICD-10-CM | POA: Diagnosis not present

## 2023-03-31 IMAGING — MG MM DIGITAL SCREENING BILAT W/ TOMO AND CAD
8 series · 8 of 24 positions shown · non-contrast
Comparison: Previous exam(s).

CLINICAL DATA: Screening.

EXAM:
DIGITAL SCREENING BILATERAL MAMMOGRAM WITH TOMOSYNTHESIS AND CAD
TECHNIQUE: Bilateral screening digital craniocaudal and mediolateral oblique
mammograms were obtained. Bilateral screening digital breast
tomosynthesis was performed. The images were evaluated with
computer-aided detection.

[L MLO synth-2D]
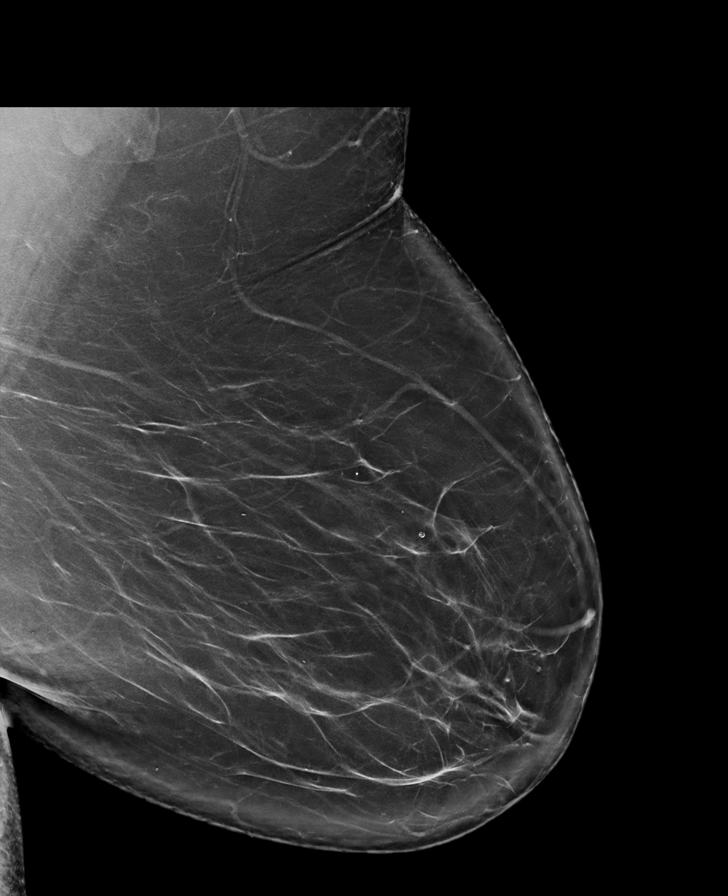

[R MLO synth-2D]
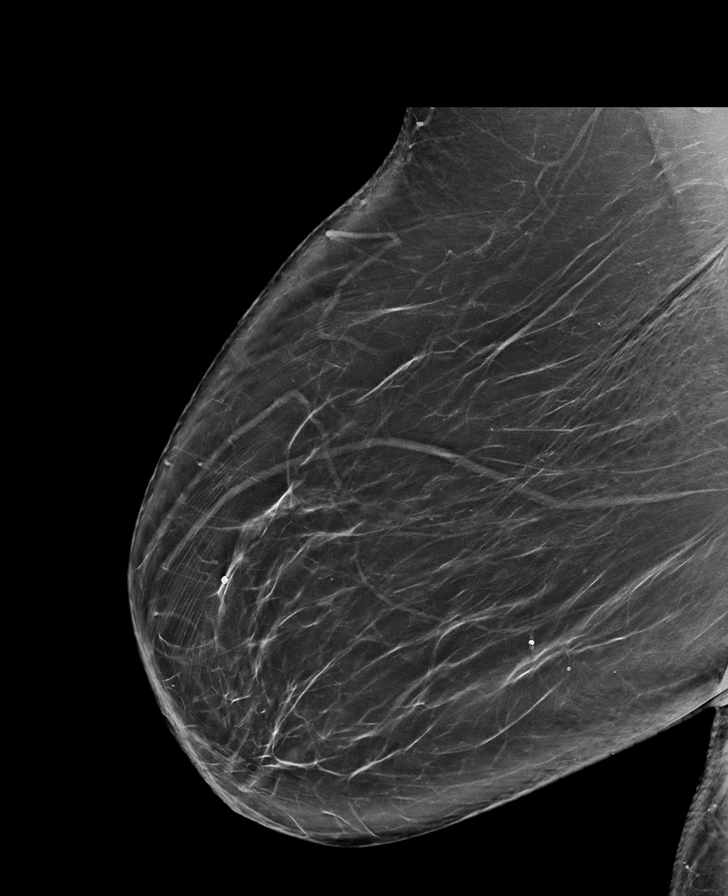

[R CC synth-2D]
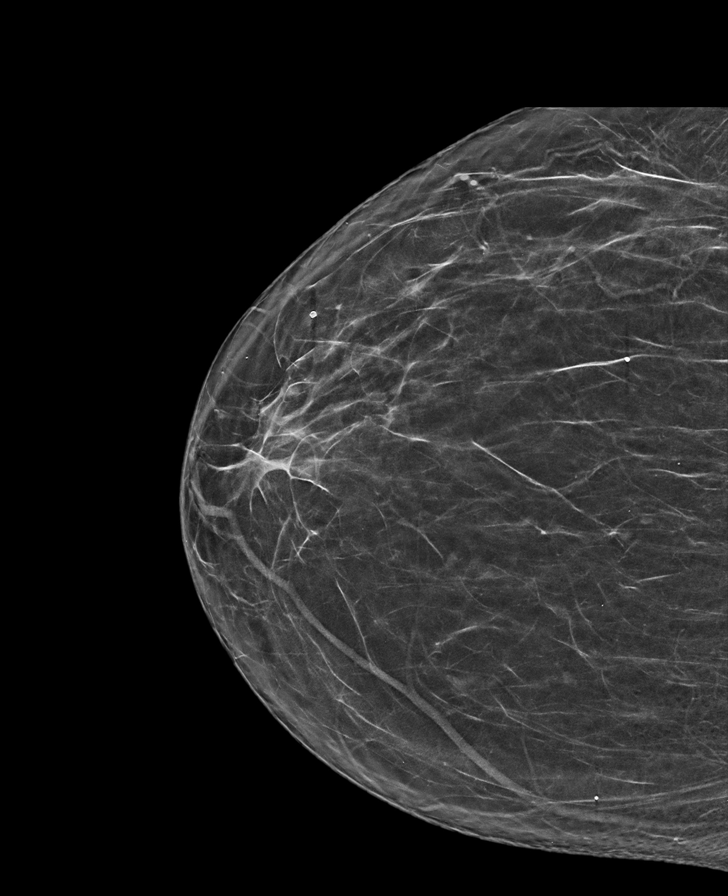

[L CC synth-2D]
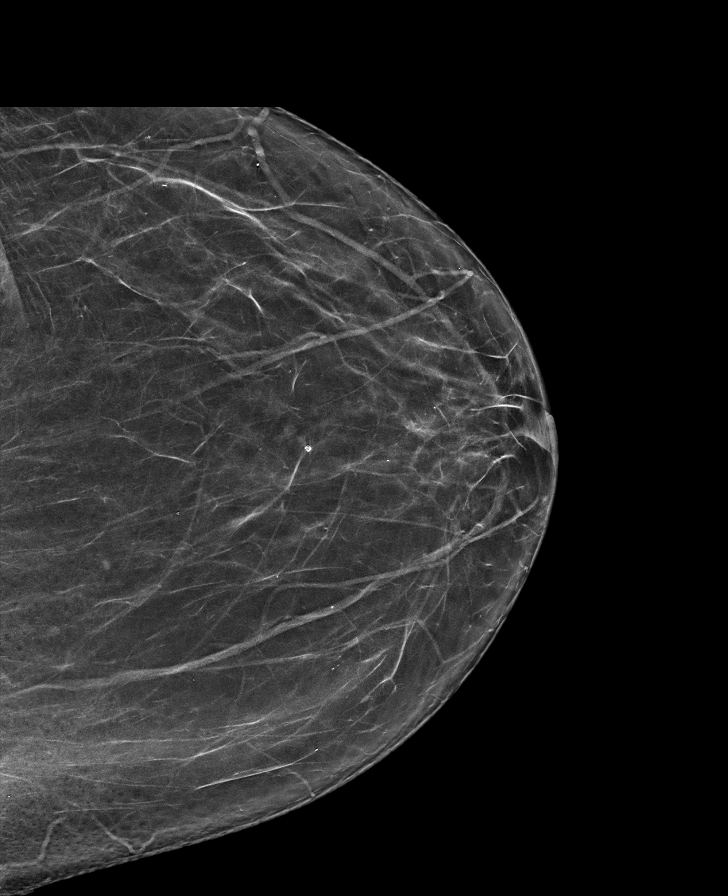

[R MLO tomo · tomo slice 49/97.0]
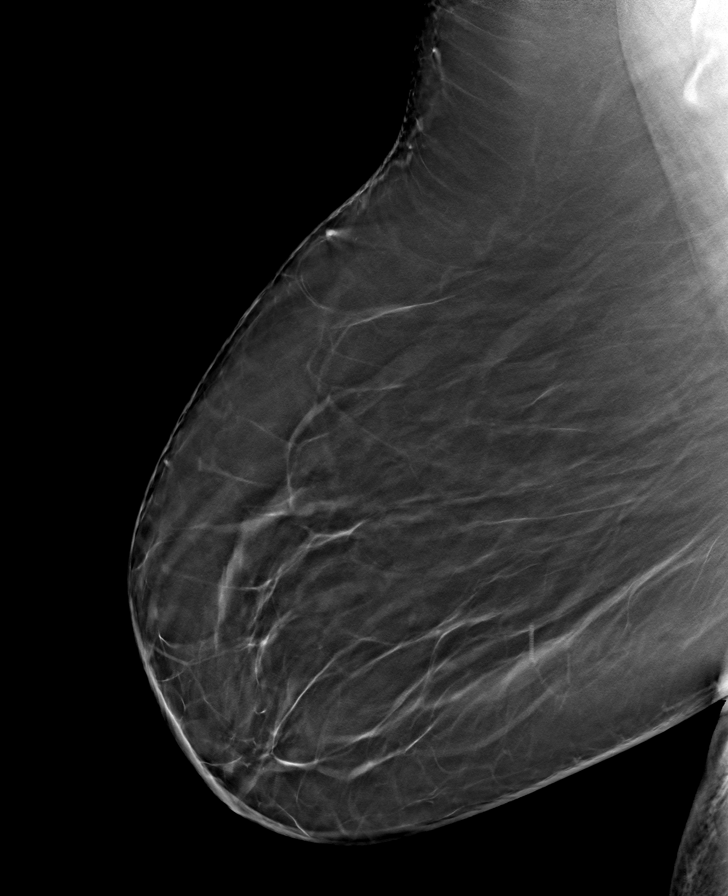

[R CC tomo · tomo slice 35/69.0]
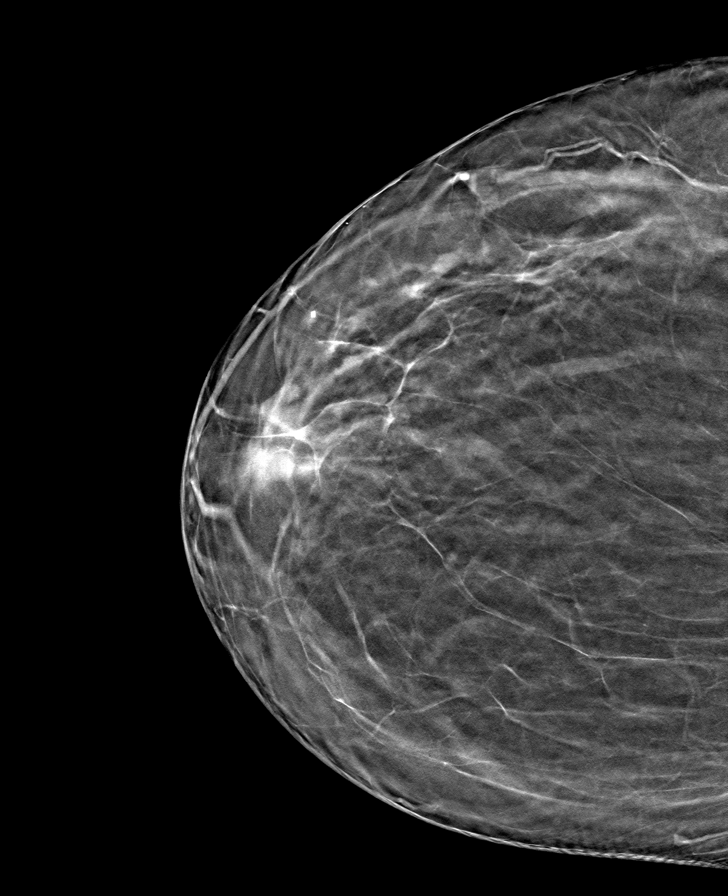

[L CC tomo · tomo slice 39/77.0]
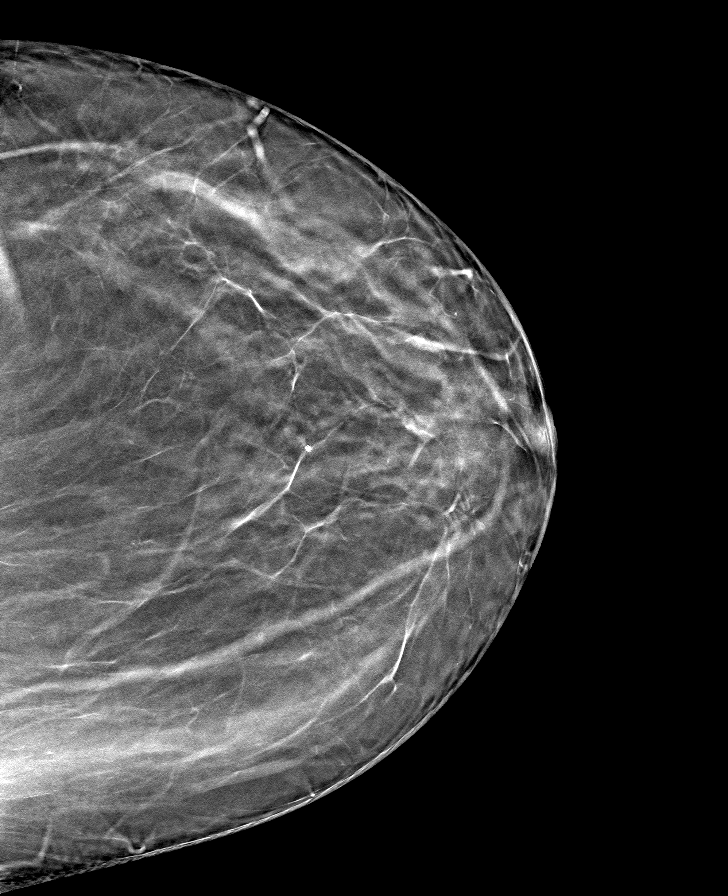

[L MLO tomo · tomo slice 53/106.0]
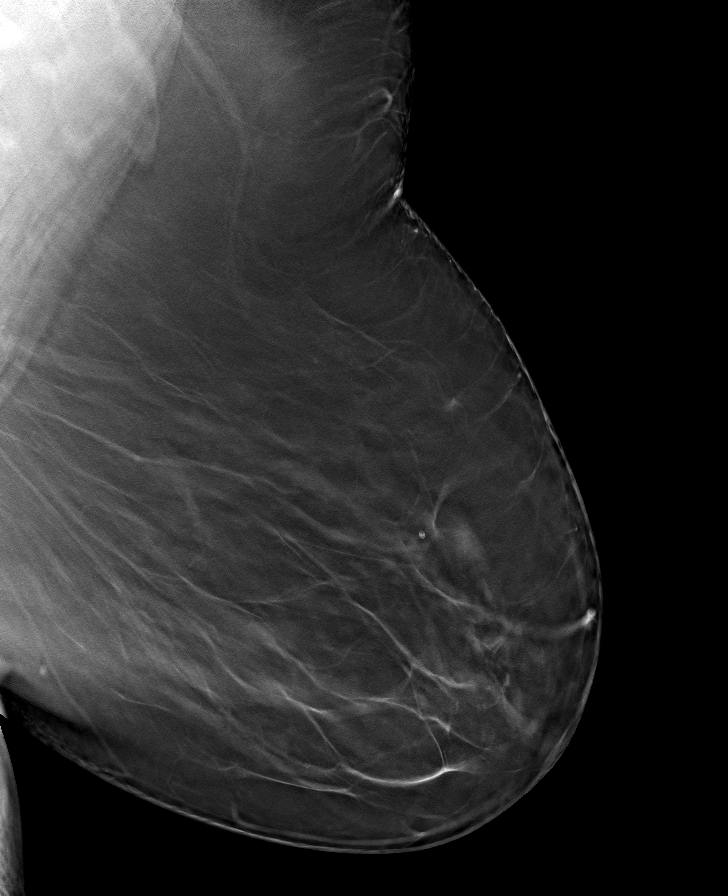

[8 of 24 positions shown; findings below may reference images not displayed]

ACR Breast Density Category b: There are scattered areas of
fibroglandular density.
FINDINGS: There are no findings suspicious for malignancy.
IMPRESSION: No mammographic evidence of malignancy. A result letter of this
screening mammogram will be mailed directly to the patient.

RECOMMENDATION:
Screening mammogram in one year. (Code:51-O-LD2)

BI-RADS CATEGORY  1: Negative.

## 2023-04-24 ENCOUNTER — Ambulatory Visit: Payer: Medicare Other

## 2023-05-16 ENCOUNTER — Ambulatory Visit: Payer: Medicare Other

## 2023-05-28 IMAGING — DX DG CHEST 1V PORT
1 series · 1 of 1 positions shown · non-contrast
Comparison: None.

CLINICAL DATA: Hypoxia

EXAM:
PORTABLE CHEST 1 VIEW

[chest ap]
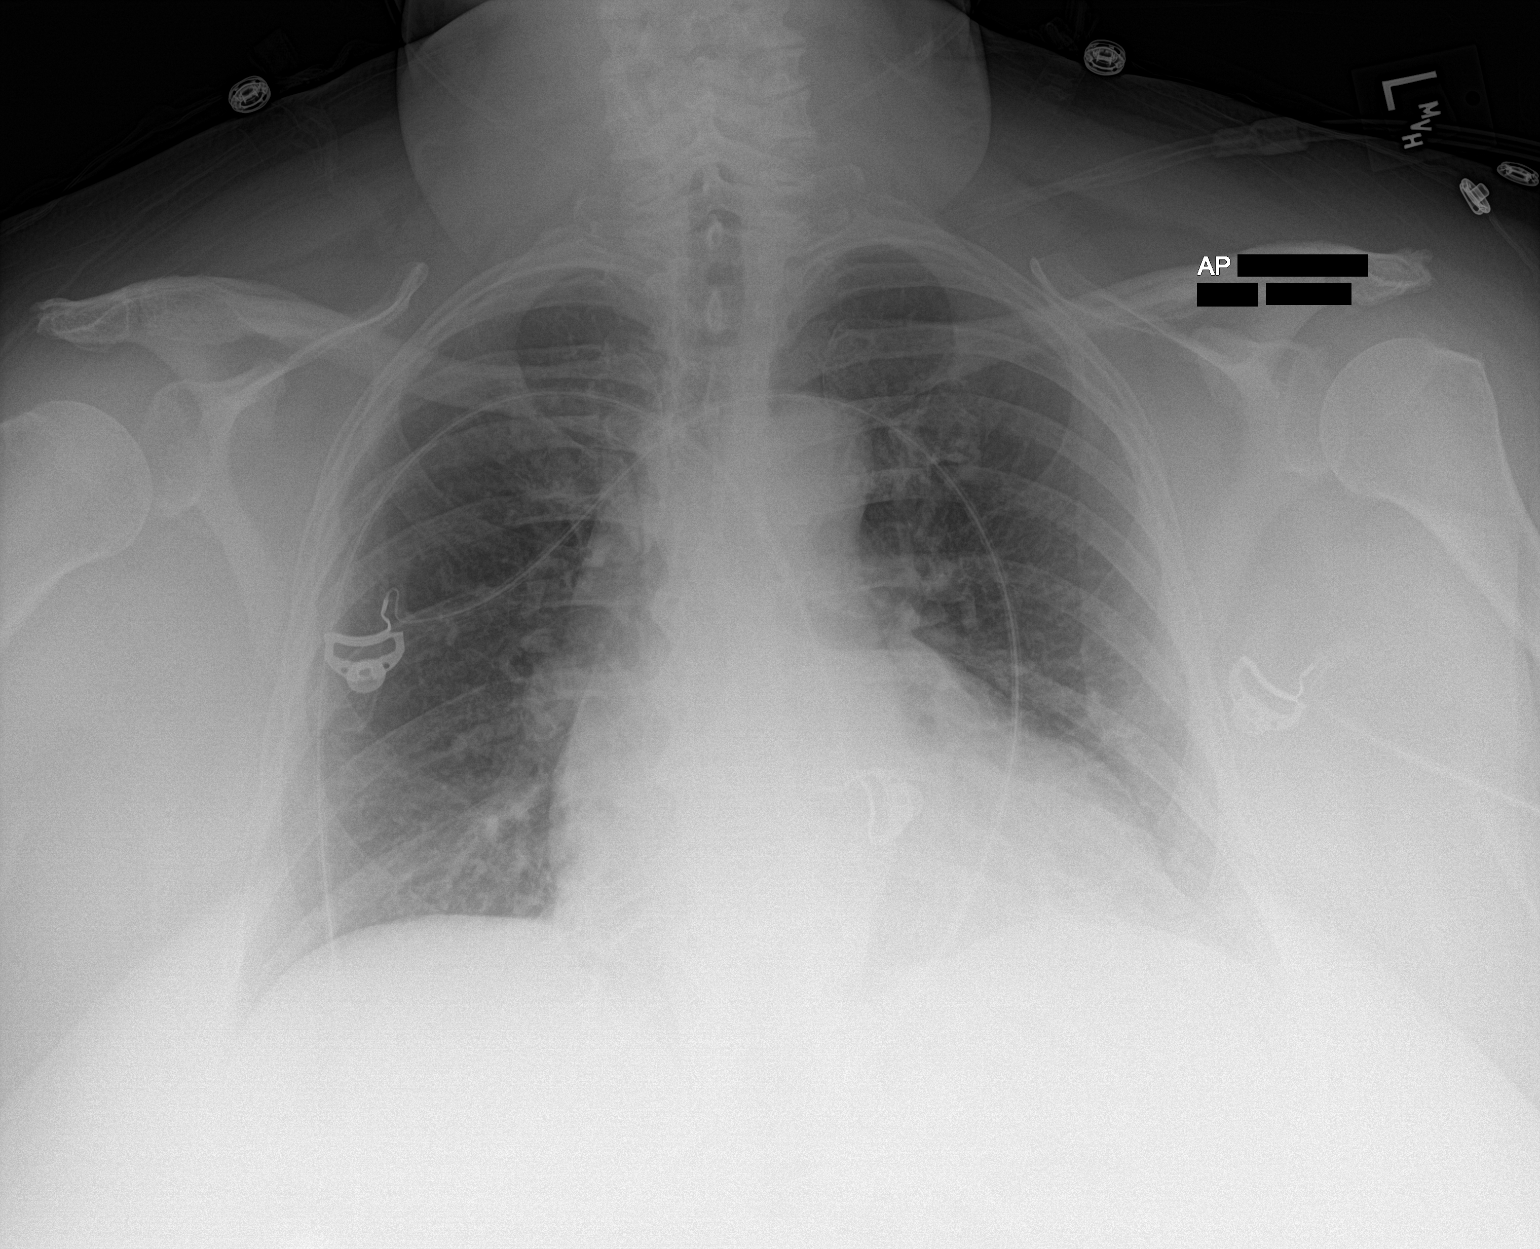

[1 of 1 positions shown; findings below may reference images not displayed]

FINDINGS: Transverse diameter of heart is increased. There are no signs of
pulmonary edema or focal pulmonary consolidation. There is no
pleural effusion or pneumothorax.
IMPRESSION: Cardiomegaly. There are no signs of pulmonary edema or focal
pulmonary consolidation.

## 2023-05-30 DIAGNOSIS — E785 Hyperlipidemia, unspecified: Secondary | ICD-10-CM | POA: Diagnosis not present

## 2023-05-30 DIAGNOSIS — R739 Hyperglycemia, unspecified: Secondary | ICD-10-CM | POA: Diagnosis not present

## 2023-06-03 ENCOUNTER — Ambulatory Visit
Admission: RE | Admit: 2023-06-03 | Discharge: 2023-06-03 | Disposition: A | Discharge status: Home / Self Care | Payer: Medicare Other | Source: Ambulatory Visit | Attending: Family Medicine | Admitting: Family Medicine

## 2023-06-03 DIAGNOSIS — Z1231 Encounter for screening mammogram for malignant neoplasm of breast: Secondary | ICD-10-CM | POA: Diagnosis not present

## 2023-06-04 DIAGNOSIS — Z789 Other specified health status: Secondary | ICD-10-CM | POA: Diagnosis not present

## 2023-06-04 DIAGNOSIS — E1165 Type 2 diabetes mellitus with hyperglycemia: Secondary | ICD-10-CM | POA: Diagnosis not present

## 2023-06-04 DIAGNOSIS — G4733 Obstructive sleep apnea (adult) (pediatric): Secondary | ICD-10-CM | POA: Diagnosis not present

## 2023-06-04 DIAGNOSIS — I10 Essential (primary) hypertension: Secondary | ICD-10-CM | POA: Diagnosis not present

## 2023-06-04 DIAGNOSIS — E782 Mixed hyperlipidemia: Secondary | ICD-10-CM | POA: Diagnosis not present

## 2023-06-05 DIAGNOSIS — I10 Essential (primary) hypertension: Secondary | ICD-10-CM | POA: Diagnosis not present

## 2023-06-05 DIAGNOSIS — Z1211 Encounter for screening for malignant neoplasm of colon: Secondary | ICD-10-CM | POA: Diagnosis not present

## 2023-06-18 DIAGNOSIS — Z1212 Encounter for screening for malignant neoplasm of rectum: Secondary | ICD-10-CM | POA: Diagnosis not present

## 2023-06-18 DIAGNOSIS — Z1211 Encounter for screening for malignant neoplasm of colon: Secondary | ICD-10-CM | POA: Diagnosis not present

## 2023-06-23 DIAGNOSIS — M542 Cervicalgia: Secondary | ICD-10-CM | POA: Diagnosis not present

## 2023-06-23 DIAGNOSIS — Z79891 Long term (current) use of opiate analgesic: Secondary | ICD-10-CM | POA: Diagnosis not present

## 2023-06-23 DIAGNOSIS — G894 Chronic pain syndrome: Secondary | ICD-10-CM | POA: Diagnosis not present

## 2023-06-30 ENCOUNTER — Encounter: Payer: Self-pay | Admitting: Obstetrics and Gynecology

## 2023-07-21 DIAGNOSIS — I10 Essential (primary) hypertension: Secondary | ICD-10-CM | POA: Diagnosis not present

## 2023-07-21 DIAGNOSIS — G4733 Obstructive sleep apnea (adult) (pediatric): Secondary | ICD-10-CM | POA: Diagnosis not present

## 2023-07-21 DIAGNOSIS — E1165 Type 2 diabetes mellitus with hyperglycemia: Secondary | ICD-10-CM | POA: Diagnosis not present

## 2023-08-13 DIAGNOSIS — J069 Acute upper respiratory infection, unspecified: Secondary | ICD-10-CM | POA: Diagnosis not present

## 2023-08-13 DIAGNOSIS — B9689 Other specified bacterial agents as the cause of diseases classified elsewhere: Secondary | ICD-10-CM | POA: Diagnosis not present

## 2023-08-13 DIAGNOSIS — J329 Chronic sinusitis, unspecified: Secondary | ICD-10-CM | POA: Diagnosis not present

## 2023-08-13 DIAGNOSIS — G4733 Obstructive sleep apnea (adult) (pediatric): Secondary | ICD-10-CM | POA: Diagnosis not present

## 2023-08-13 DIAGNOSIS — E1165 Type 2 diabetes mellitus with hyperglycemia: Secondary | ICD-10-CM | POA: Diagnosis not present

## 2023-09-12 DIAGNOSIS — R5383 Other fatigue: Secondary | ICD-10-CM | POA: Diagnosis not present

## 2023-09-12 DIAGNOSIS — R739 Hyperglycemia, unspecified: Secondary | ICD-10-CM | POA: Diagnosis not present

## 2023-09-12 DIAGNOSIS — E785 Hyperlipidemia, unspecified: Secondary | ICD-10-CM | POA: Diagnosis not present

## 2023-09-17 DIAGNOSIS — Z789 Other specified health status: Secondary | ICD-10-CM | POA: Diagnosis not present

## 2023-09-17 DIAGNOSIS — E782 Mixed hyperlipidemia: Secondary | ICD-10-CM | POA: Diagnosis not present

## 2023-09-17 DIAGNOSIS — I10 Essential (primary) hypertension: Secondary | ICD-10-CM | POA: Diagnosis not present

## 2023-09-17 DIAGNOSIS — E1121 Type 2 diabetes mellitus with diabetic nephropathy: Secondary | ICD-10-CM | POA: Diagnosis not present

## 2023-09-17 DIAGNOSIS — E1165 Type 2 diabetes mellitus with hyperglycemia: Secondary | ICD-10-CM | POA: Diagnosis not present

## 2023-10-21 DIAGNOSIS — Z79899 Other long term (current) drug therapy: Secondary | ICD-10-CM | POA: Diagnosis not present

## 2023-10-21 DIAGNOSIS — G894 Chronic pain syndrome: Secondary | ICD-10-CM | POA: Diagnosis not present

## 2023-10-21 DIAGNOSIS — Z79891 Long term (current) use of opiate analgesic: Secondary | ICD-10-CM | POA: Diagnosis not present

## 2023-11-10 DIAGNOSIS — H5202 Hypermetropia, left eye: Secondary | ICD-10-CM | POA: Diagnosis not present

## 2023-11-10 DIAGNOSIS — E119 Type 2 diabetes mellitus without complications: Secondary | ICD-10-CM | POA: Diagnosis not present

## 2024-03-02 DIAGNOSIS — M545 Low back pain, unspecified: Secondary | ICD-10-CM | POA: Diagnosis not present

## 2024-03-02 DIAGNOSIS — Z5181 Encounter for therapeutic drug level monitoring: Secondary | ICD-10-CM | POA: Diagnosis not present

## 2024-03-02 DIAGNOSIS — G894 Chronic pain syndrome: Secondary | ICD-10-CM | POA: Diagnosis not present

## 2024-03-15 DIAGNOSIS — E782 Mixed hyperlipidemia: Secondary | ICD-10-CM | POA: Diagnosis not present

## 2024-03-15 DIAGNOSIS — E1165 Type 2 diabetes mellitus with hyperglycemia: Secondary | ICD-10-CM | POA: Diagnosis not present

## 2024-03-15 DIAGNOSIS — I10 Essential (primary) hypertension: Secondary | ICD-10-CM | POA: Diagnosis not present

## 2024-03-24 DIAGNOSIS — E1165 Type 2 diabetes mellitus with hyperglycemia: Secondary | ICD-10-CM | POA: Diagnosis not present

## 2024-03-24 DIAGNOSIS — Z79899 Other long term (current) drug therapy: Secondary | ICD-10-CM | POA: Diagnosis not present

## 2024-03-24 DIAGNOSIS — E1169 Type 2 diabetes mellitus with other specified complication: Secondary | ICD-10-CM | POA: Diagnosis not present

## 2024-03-24 DIAGNOSIS — E782 Mixed hyperlipidemia: Secondary | ICD-10-CM | POA: Diagnosis not present

## 2024-03-24 DIAGNOSIS — Z789 Other specified health status: Secondary | ICD-10-CM | POA: Diagnosis not present

## 2024-03-24 DIAGNOSIS — Z7985 Long-term (current) use of injectable non-insulin antidiabetic drugs: Secondary | ICD-10-CM | POA: Diagnosis not present

## 2024-03-24 DIAGNOSIS — I10 Essential (primary) hypertension: Secondary | ICD-10-CM | POA: Diagnosis not present

## 2024-04-13 ENCOUNTER — Encounter: Attending: Family Medicine | Admitting: Skilled Nursing Facility1

## 2024-04-13 ENCOUNTER — Encounter: Payer: Self-pay | Admitting: Skilled Nursing Facility1

## 2024-04-13 DIAGNOSIS — E118 Type 2 diabetes mellitus with unspecified complications: Secondary | ICD-10-CM | POA: Insufficient documentation

## 2024-04-13 NOTE — Progress Notes (Signed)
 Pt states she has a Lapband placed stating it has not been filled in years but it is not empty. Pt states she does not often struggle with food getting stuck.  Pt states she does not have a strong appetite and goes days without eating often. Pt states her husband is having plaque buildup in his brain leading to forgetfulness and inability to drive. Pt states they moved here from [place] in 2018.  Pt states she does have a CGM but is not currently wearing one stating she often get ripped off. Pt states she does not eat carbs because she does not like it.  Pt states she doe snot typically think of herself for self care but tries to get her nails and hair done. Pt states she does brush twice daily and floss.    DM medications: Monjauro  Jardiance  A1C 9.1  Diabetes Self-Management Education  Visit Type: First/Initial  Appt. Start Time: 2:03  Appt. End Time: 3:05  04/13/2024  Ms. Risha Barretta, identified by name and date of birth, is a 63 y.o. female with a diagnosis of Diabetes: Type 2.   ASSESSMENT  Last menstrual period 05/14/2021. There is no height or weight on file to calculate BMI.   Diabetes Self-Management Education - 04/13/24 1407       Visit Information   Visit Type First/Initial      Initial Visit   Diabetes Type Type 2    Are you currently following a meal plan? No    Are you taking your medications as prescribed? No      Health Coping   How would you rate your overall health? Good      Psychosocial Assessment   Patient Belief/Attitude about Diabetes Defeat/Burnout    What is the hardest part about your diabetes right now, causing you the most concern, or is the most worrisome to you about your diabetes?   Making healty food and beverage choices;Being active    Self-management support Family;Friends    Patient Concerns Nutrition/Meal planning;Healthy Lifestyle    Special Needs None    Preferred Learning Style Visual;Hands on    Learning Readiness Not Ready     How often do you need to have someone help you when you read instructions, pamphlets, or other written materials from your doctor or pharmacy? 1 - Never      Pre-Education Assessment   Patient understands the diabetes disease and treatment process. Needs Instruction    Patient understands incorporating nutritional management into lifestyle. Needs Instruction    Patient undertands incorporating physical activity into lifestyle. Needs Instruction    Patient understands using medications safely. Needs Instruction    Patient understands monitoring blood glucose, interpreting and using results Needs Instruction    Patient understands prevention, detection, and treatment of acute complications. Needs Instruction    Patient understands prevention, detection, and treatment of chronic complications. Needs Instruction    Patient understands how to develop strategies to address psychosocial issues. Needs Instruction    Patient understands how to develop strategies to promote health/change behavior. Needs Instruction      Complications   Last HgB A1C per patient/outside source 9.1 %    How often do you check your blood sugar? 3-4 times/day    Have you had a dilated eye exam in the past 12 months? Yes    Have you had a dental exam in the past 12 months? No    Are you checking your feet? Yes    How many  days per week are you checking your feet? 7      Dietary Intake   Breakfast skipped    Lunch skipped    Dinner skipped    Beverage(s) soda, arnold parlmer, water       Activity / Exercise   Activity / Exercise Type ADL's    How many days per week do you exercise? 0    How many minutes per day do you exercise? 0    Total minutes per week of exercise 0      Patient Education   Previous Diabetes Education No    Disease Pathophysiology Factors that contribute to the development of diabetes;Explored patient's options for treatment of their diabetes    Healthy Eating Role of diet in the treatment of  diabetes and the relationship between the three main macronutrients and blood glucose level;Food label reading, portion sizes and measuring food.;Plate Method;Reviewed blood glucose goals for pre and post meals and how to evaluate the patients' food intake on their blood glucose level.;Information on hints to eating out and maintain blood glucose control.;Meal options for control of blood glucose level and chronic complications.    Being Active Helped patient identify appropriate exercises in relation to his/her diabetes, diabetes complications and other health issue.    Medications Reviewed patients medication for diabetes, action, purpose, timing of dose and side effects.    Monitoring Taught/evaluated CGM (comment);Purpose and frequency of SMBG.;Daily foot exams;Yearly dilated eye exam    Acute complications Taught prevention, symptoms, and  treatment of hypoglycemia - the 15 rule.;Discussed and identified patients' prevention, symptoms, and treatment of hyperglycemia.    Chronic complications Dental care;Retinopathy and reason for yearly dilated eye exams;Nephropathy, what it is, prevention of, the use of ACE, ARB's and early detection of through urine microalbumia.;Lipid levels, blood glucose control and heart disease;Assessed and discussed foot care and prevention of foot problems    Diabetes Stress and Support Role of stress on diabetes;Identified and addressed patients feelings and concerns about diabetes;Helped patient identify a support system for diabetes management      Individualized Goals (developed by patient)   Nutrition Follow meal plan discussed;General guidelines for healthy choices and portions discussed    Physical Activity Exercise 1-2 times per week;15 minutes per day    Monitoring  Consistenly use CGM;Test my blood glucose as discussed;Test blood glucose pre and post meals as discussed    Problem Solving Eating Pattern    Reducing Risk do foot checks daily;treat hypoglycemia with  15 grams of carbs if blood glucose less than 70mg /dL      Post-Education Assessment   Patient understands the diabetes disease and treatment process. Demonstrates understanding / competency    Patient understands incorporating nutritional management into lifestyle. Demonstrates understanding / competency    Patient undertands incorporating physical activity into lifestyle. Demonstrates understanding / competency    Patient understands using medications safely. Demonstrates understanding / competency    Patient understands monitoring blood glucose, interpreting and using results Demonstrates understanding / competency    Patient understands prevention, detection, and treatment of acute complications. Demonstrates understanding / competency    Patient understands prevention, detection, and treatment of chronic complications. Demonstrates understanding / competency    Patient understands how to develop strategies to address psychosocial issues. Demonstrates understanding / competency    Patient understands how to develop strategies to promote health/change behavior. Demonstrates understanding / competency      Outcomes   Expected Outcomes Demonstrated interest in learning but significant barriers to change  Future DMSE 3-4 months    Program Status Completed          Individualized Plan for Diabetes Self-Management Training:   Learning Objective:  Patient will have a greater understanding of diabetes self-management. Patient education plan is to attend individual and/or group sessions per assessed needs and concerns.    Expected Outcomes:  Demonstrated interest in learning but significant barriers to change  Education material provided: ADA - How to Thrive: A Guide for Your Journey with Diabetes, Food label handouts, A1C conversion sheet, Meal plan card, My Plate, and Snack sheet  If problems or questions, patient to contact team via:  Phone and Email  Future DSME appointment: 3-4  months
